# Patient Record
Sex: Female | Born: 1970 | Race: White | Hispanic: No | Marital: Married | Smoking: Current every day smoker
Health system: Southern US, Community
[De-identification: ages and names within clinical notes are randomized; demographics above are authoritative.]

## PROBLEM LIST (undated history)

## (undated) DIAGNOSIS — F419 Anxiety disorder, unspecified: Secondary | ICD-10-CM

## (undated) HISTORY — PX: WISDOM TOOTH EXTRACTION: SHX21

---

## 1997-12-22 HISTORY — PX: TUBAL LIGATION: SHX77

## 2022-01-09 ENCOUNTER — Encounter (INDEPENDENT_AMBULATORY_CARE_PROVIDER_SITE_OTHER): Payer: Self-pay | Admitting: *Deleted

## 2022-01-13 ENCOUNTER — Other Ambulatory Visit: Payer: Self-pay | Admitting: Certified Nurse Midwife

## 2022-01-13 DIAGNOSIS — N6489 Other specified disorders of breast: Secondary | ICD-10-CM

## 2022-01-14 ENCOUNTER — Other Ambulatory Visit: Payer: Self-pay | Admitting: Certified Nurse Midwife

## 2022-01-14 DIAGNOSIS — R928 Other abnormal and inconclusive findings on diagnostic imaging of breast: Secondary | ICD-10-CM

## 2022-01-22 ENCOUNTER — Ambulatory Visit
Admission: RE | Admit: 2022-01-22 | Discharge: 2022-01-22 | Disposition: A | Discharge status: Home / Self Care | Payer: Commercial Managed Care - HMO | Source: Ambulatory Visit | Attending: Certified Nurse Midwife | Admitting: Certified Nurse Midwife

## 2022-01-22 ENCOUNTER — Other Ambulatory Visit: Payer: Self-pay

## 2022-01-22 ENCOUNTER — Ambulatory Visit
Admission: RE | Admit: 2022-01-22 | Discharge: 2022-01-22 | Disposition: A | Discharge status: Home / Self Care | Payer: Self-pay | Source: Ambulatory Visit | Attending: Certified Nurse Midwife | Admitting: Certified Nurse Midwife

## 2022-01-22 ENCOUNTER — Other Ambulatory Visit: Payer: Self-pay | Admitting: Certified Nurse Midwife

## 2022-01-22 DIAGNOSIS — R928 Other abnormal and inconclusive findings on diagnostic imaging of breast: Secondary | ICD-10-CM

## 2022-01-22 HISTORY — PX: BREAST BIOPSY: SHX20

## 2022-01-29 ENCOUNTER — Encounter: Payer: Self-pay | Admitting: Adult Health

## 2022-01-29 DIAGNOSIS — C50111 Malignant neoplasm of central portion of right female breast: Secondary | ICD-10-CM | POA: Insufficient documentation

## 2022-02-03 ENCOUNTER — Telehealth: Payer: Self-pay | Admitting: Hematology and Oncology

## 2022-02-03 ENCOUNTER — Encounter: Payer: Self-pay | Admitting: Genetic Counselor

## 2022-02-03 ENCOUNTER — Other Ambulatory Visit: Payer: Self-pay | Admitting: Genetic Counselor

## 2022-02-03 DIAGNOSIS — C50111 Malignant neoplasm of central portion of right female breast: Secondary | ICD-10-CM

## 2022-02-03 DIAGNOSIS — Z170 Estrogen receptor positive status [ER+]: Secondary | ICD-10-CM

## 2022-02-03 NOTE — Telephone Encounter (Signed)
Scheduled pt for both oncology and genetics per 2/13 referral. Spoke to pt who is aware of both appts date and times. Pt is aware to arrive 15 mins prior to appt time.

## 2022-02-05 ENCOUNTER — Other Ambulatory Visit: Payer: Self-pay | Admitting: General Surgery

## 2022-02-05 ENCOUNTER — Other Ambulatory Visit (HOSPITAL_COMMUNITY): Payer: Self-pay | Admitting: General Surgery

## 2022-02-05 DIAGNOSIS — C50011 Malignant neoplasm of nipple and areola, right female breast: Secondary | ICD-10-CM

## 2022-02-05 DIAGNOSIS — Z170 Estrogen receptor positive status [ER+]: Secondary | ICD-10-CM

## 2022-02-05 DIAGNOSIS — R1031 Right lower quadrant pain: Secondary | ICD-10-CM

## 2022-02-05 DIAGNOSIS — C50111 Malignant neoplasm of central portion of right female breast: Secondary | ICD-10-CM

## 2022-02-05 NOTE — Progress Notes (Signed)
[facility] NOTE  Patient Care Team: Bradner, Vilinda Blanks, NP as PCP - General (Family Medicine)  CHIEF COMPLAINTS/PURPOSE OF CONSULTATION:  Newly diagnosed breast cancer  HISTORY OF PRESENTING ILLNESS:  Renee Schwartz 51 y.o. female is here because of recent diagnosis of right breast cancer. Patient had screening detected architectural distortion in the right breast, underwent diagnostic imaging.  No mass was seen on ultrasound and breast density was reported as C.  She had core needle biopsy which showed a grade 1 IDC, ER/PR positive, ER 95%, moderate staining, PR 100%, strong staining, HER2 negative, Ki-67 of 1%.  She has an MRI breast ordered by Dr. Barry Dienes. She is now referred to medical oncology for recommendations.  She is here with her husband.  She has MRI breast scheduled for later today.  She is otherwise healthy, denies any major medical issues.  She is an active smoker, smokes 1 pack/day.  Breast cancer runs in maternal side of the family, maternal grandmother and her sisters had breast cancer as well. Rest of the pertinent 10 point ROS reviewed and negative.  SUMMARY OF ONCOLOGIC HISTORY: Oncology History   No history exists.   MEDICAL HISTORY:  Reviewed and none.  SURGICAL HISTORY: She had tubal ligation many years ago after birth of her second child.  SOCIAL HISTORY: Social History   Socioeconomic History   Marital status: Married    Spouse name: Not on file   Number of children: Not on file   Years of education: Not on file   Highest education level: Not on file  Occupational History   Not on file  Tobacco Use   Smoking status: Not on file   Smokeless tobacco: Not on file  Substance and Sexual Activity   Alcohol use: Not on file   Drug use: Not on file   Sexual activity: Not on file  Other Topics Concern   Not on file  Social History Narrative   Not on file   Social Determinants of Health   Financial Resource Strain: Not on file   Food Insecurity: Not on file  Transportation Needs: Not on file  Physical Activity: Not on file  Stress: Not on file  Social Connections: Not on file  Intimate Partner Violence: Not on file    FAMILY HISTORY: Family History  Problem Relation Age of Onset   Cancer Mother 83       abdominal cancer; refused treatment - Panc vs colon   Huntington's disease Father 56       d. 55   Non-Hodgkin's lymphoma Brother 40   Cancer Maternal Grandmother        GYN cancer   Breast cancer Maternal Grandmother 50   Huntington's disease Paternal Grandmother 20       d. 18   Wilm's tumor Son 3       second dx at 5   Breast cancer Other 51       dbl mastectomy - MGMs sister    ALLERGIES:  has no allergies on file.  MEDICATIONS:  No current outpatient medications on file.   No current facility-administered medications for this visit.    REVIEW OF SYSTEMS:   Constitutional: Denies fevers, chills or abnormal night sweats Eyes: Denies blurriness of vision, double vision or watery eyes Ears, nose, mouth, throat, and face: Denies mucositis or sore throat Respiratory: Denies cough, dyspnea or wheezes Cardiovascular: Denies palpitation, chest discomfort or lower extremity swelling Gastrointestinal:  Denies nausea, heartburn or change  in bowel habits Skin: Denies abnormal skin rashes Lymphatics: Denies new lymphadenopathy or easy bruising Neurological:Denies numbness, tingling or new weaknesses Behavioral/Psych: Mood is stable, no new changes  Breast:  Denies any palpable lumps or discharge All other systems were reviewed with the patient and are negative.  PHYSICAL EXAMINATION: ECOG PERFORMANCE STATUS: 0 - Asymptomatic  Vitals:   02/06/22 0932  BP: 119/90  Pulse: 90  Resp: 16  Temp: 98.1 F (36.7 C)  SpO2: 96%   Filed Weights   02/06/22 0932  Weight: 176 lb 4.8 oz (80 kg)    GENERAL:alert, no distress and comfortable SKIN: skin color, texture, turgor are normal, no rashes or  significant lesions EYES: normal, conjunctiva are pink and non-injected, sclera clear OROPHARYNX:no exudate, no erythema and lips, buccal mucosa, and tongue normal  NECK: supple, thyroid normal size, non-tender, without nodularity LYMPH:  no palpable lymphadenopathy in the cervical, axillary or inguinal LUNGS: clear to auscultation and percussion with normal breathing effort HEART: regular rate & rhythm and no murmurs and no lower extremity edema ABDOMEN:abdomen soft, non-tender and normal bowel sounds Musculoskeletal:no cyanosis of digits and no clubbing  PSYCH: alert & oriented x 3 with fluent speech NEURO: no focal motor/sensory deficits BREAST: Bilateral breasts inspected.  Right breast 12 o'clock position biopsy site noted, no large palpable mass, some palpable firmness measuring about a centimeter noted.  No other regional lymphadenopathy.  Left breast normal to inspection and palpation.  LABORATORY DATA:  I have reviewed the data as listed Lab Results  Component Value Date   WBC 12.1 (H) 02/06/2022   HGB 16.0 (H) 02/06/2022   HCT 48.0 (H) 02/06/2022   MCV 87.8 02/06/2022   PLT 343 02/06/2022   Lab Results  Component Value Date   NA 138 02/06/2022   K 4.2 02/06/2022   CL 105 02/06/2022   CO2 27 02/06/2022    RADIOGRAPHIC STUDIES: I have personally reviewed the radiological reports and agreed with the findings in the report.  ASSESSMENT AND PLAN:  Malignant neoplasm of central portion of right breast in female, estrogen receptor positive ([facility]) This is a very pleasant 51 year old premenopausal female patient with newly diagnosed right breast invasive ductal carcinoma, size unclear at this time, awaiting MRI assessment, ER/PR positive, Ki-67 of 1%, HER2 negative on biopsy referred to breast center for further recommendations.  I do agree with considering MRI since we do not have an exact size on this tumor specimen.  Following MRI, if this tumor is indeed small, she can proceed  with upfront surgery followed by Oncotype testing if the tumor is greater than 5 mm.  We have discussed the following details about Oncotype.  We have discussed about Oncotype Dx score which is a well validated prognostic scoring system which can predict outcome with endocrine therapy alone and whether chemotherapy reduces recurrence.  Typically in patients with ER positive cancers that are node negative if the RS score is high typically greater than or equal to 26, chemotherapy is recommended.  In women with intermediate recurrence score younger than 13, there can still be some role for chemotherapy in addition to endocrine therapy especially if the recurrence score is between 21-25. If chemotherapy is needed, this will precede radiation and then after radiation she will continue on antiestrogen therapy.  Following Oncotype, she can proceed with adjuvant radiation if there is no indication for chemotherapy.  Following radiation, we have discussed about the consideration of antiestrogen therapy for 5 years.  We have discussed about tamoxifen  as well as aromatase inhibitors as well as ovarian suppression since she is premenopausal.  Given active smoking, we have discussed mostly about ovarian suppression and aromatase inhibitors concomitantly for the next 5 years.  Discussed about genetic testing, continuing lifestyle interventions such as regular exercise, healthy diet with more focus on plant-based diet, limiting alcohol intake and avoiding any hormone replacement therapy.  She will return to clinic for follow-up after surgery.  All questions were answered. The patient knows to call the clinic with any problems, questions or concerns.    Benay Pike, MD 02/06/22

## 2022-02-06 ENCOUNTER — Inpatient Hospital Stay: Payer: 59 | Attending: Hematology and Oncology | Admitting: Hematology and Oncology

## 2022-02-06 ENCOUNTER — Inpatient Hospital Stay: Payer: 59

## 2022-02-06 ENCOUNTER — Ambulatory Visit
Admission: RE | Admit: 2022-02-06 | Discharge: 2022-02-06 | Disposition: A | Discharge status: Home / Self Care | Payer: 59 | Source: Ambulatory Visit | Attending: General Surgery | Admitting: General Surgery

## 2022-02-06 ENCOUNTER — Inpatient Hospital Stay (HOSPITAL_BASED_OUTPATIENT_CLINIC_OR_DEPARTMENT_OTHER): Payer: 59 | Admitting: Genetic Counselor

## 2022-02-06 ENCOUNTER — Encounter: Payer: Self-pay | Admitting: Genetic Counselor

## 2022-02-06 ENCOUNTER — Other Ambulatory Visit: Payer: Self-pay

## 2022-02-06 ENCOUNTER — Other Ambulatory Visit: Payer: Self-pay | Admitting: *Deleted

## 2022-02-06 ENCOUNTER — Encounter: Payer: Self-pay | Admitting: Hematology and Oncology

## 2022-02-06 DIAGNOSIS — Z803 Family history of malignant neoplasm of breast: Secondary | ICD-10-CM | POA: Insufficient documentation

## 2022-02-06 DIAGNOSIS — Z170 Estrogen receptor positive status [ER+]: Secondary | ICD-10-CM | POA: Diagnosis not present

## 2022-02-06 DIAGNOSIS — C50111 Malignant neoplasm of central portion of right female breast: Secondary | ICD-10-CM | POA: Insufficient documentation

## 2022-02-06 DIAGNOSIS — Z8041 Family history of malignant neoplasm of ovary: Secondary | ICD-10-CM | POA: Diagnosis not present

## 2022-02-06 LAB — CMP (CANCER CENTER ONLY)
ALT: 11 U/L (ref 0–44)
AST: 12 U/L — ABNORMAL LOW (ref 15–41)
Albumin: 4.4 g/dL (ref 3.5–5.0)
Alkaline Phosphatase: 69 U/L (ref 38–126)
Anion gap: 6 (ref 5–15)
BUN: 18 mg/dL (ref 6–20)
CO2: 27 mmol/L (ref 22–32)
Calcium: 9.5 mg/dL (ref 8.9–10.3)
Chloride: 105 mmol/L (ref 98–111)
Creatinine: 0.84 mg/dL (ref 0.44–1.00)
GFR, Estimated: >60 mL/min (ref >60)
Glucose, Bld: 89 mg/dL (ref 70–99)
Potassium: 4.2 mmol/L (ref 3.5–5.1)
Sodium: 138 mmol/L (ref 135–145)
Total Bilirubin: 0.3 mg/dL (ref 0.3–1.2)
Total Protein: 7.5 g/dL (ref 6.5–8.1)

## 2022-02-06 LAB — CBC WITH DIFFERENTIAL (CANCER CENTER ONLY)
Abs Immature Granulocytes: 0.09 10*3/uL — ABNORMAL HIGH (ref 0.00–0.07)
Basophils Absolute: 0.1 10*3/uL (ref 0.0–0.1)
Basophils Relative: 0 %
Eosinophils Absolute: 0.1 10*3/uL (ref 0.0–0.5)
Eosinophils Relative: 1 %
HCT: 48 % — ABNORMAL HIGH (ref 36.0–46.0)
Hemoglobin: 16 g/dL — ABNORMAL HIGH (ref 12.0–15.0)
Immature Granulocytes: 1 %
Lymphocytes Relative: 27 %
Lymphs Abs: 3.2 10*3/uL (ref 0.7–4.0)
MCH: 29.3 pg (ref 26.0–34.0)
MCHC: 33.3 g/dL (ref 30.0–36.0)
MCV: 87.8 fL (ref 80.0–100.0)
Monocytes Absolute: 1 10*3/uL (ref 0.1–1.0)
Monocytes Relative: 9 %
Neutro Abs: 7.6 10*3/uL (ref 1.7–7.7)
Neutrophils Relative %: 62 %
Platelet Count: 343 10*3/uL (ref 150–400)
RBC: 5.47 MIL/uL — ABNORMAL HIGH (ref 3.87–5.11)
RDW: 12.5 % (ref 11.5–15.5)
WBC Count: 12.1 10*3/uL — ABNORMAL HIGH (ref 4.0–10.5)
nRBC: 0 % (ref 0.0–0.2)

## 2022-02-06 LAB — GENETIC SCREENING ORDER

## 2022-02-06 MED ORDER — GADOBUTROL 1 MMOL/ML IV SOLN
8.0000 mL | Freq: Once | INTRAVENOUS | Status: AC | PRN
  Administered 2022-02-06: 8 mL via INTRAVENOUS

## 2022-02-06 NOTE — Progress Notes (Signed)
REFERRING PROVIDER: Stark Klein, MD [address] [place],  [place] [address]  PRIMARY PROVIDER:  Bing Neighbors, NP  PRIMARY REASON FOR VISIT:  1. Malignant neoplasm of central portion of right breast in female, estrogen receptor positive ([place])   2. Family history of breast cancer      HISTORY OF PRESENT ILLNESS:   Renee Schwartz, a 51 y.o. female, was seen for a [facility] cancer genetics consultation at the request of Dr. Barry Dienes due to a personal and family history of breast cancer.  Renee Schwartz presents to clinic today to discuss the possibility of a hereditary predisposition to cancer, genetic testing, and to further clarify her future cancer risks, as well as potential cancer risks for family members.   In 2023, at the age of 59, Renee Schwartz was diagnosed with breast cancer.  The treatment plan includes a lumpectomy and radiation.  The patient reports [place] age 53.  Over her time there she reports hearing about 3 radiation leaks.     CANCER HISTORY:  Oncology History   No history exists.     RISK FACTORS:  Menarche was at age 76.  First live birth at age 72.  OCP use for approximately 0 years.  Ovaries intact: yes.  Hysterectomy: no.  Menopausal status: perimenopausal.  HRT use: 0 years. Colonoscopy: no; not examined. Mammogram within the last year: yes. Number of breast biopsies: 1. Up to date with pelvic exams: yes. Any excessive radiation exposure in the past: yes  No past medical history on file.    Social History   Socioeconomic History   Marital status: Married    Spouse name: Not on file   Number of children: Not on file   Years of education: Not on file   Highest education level: Not on file  Occupational History   Not on file  Tobacco Use   Smoking status: Not on file   Smokeless tobacco: Not on file  Substance and Sexual Activity   Alcohol use: Not on file   Drug use: Not on file   Sexual  activity: Not on file  Other Topics Concern   Not on file  Social History Narrative   Not on file   Social Determinants of Health   Financial Resource Strain: Not on file  Food Insecurity: Not on file  Transportation Needs: Not on file  Physical Activity: Not on file  Stress: Not on file  Social Connections: Not on file     FAMILY HISTORY:  We obtained a detailed, 4-generation family history.  Significant diagnoses are listed below: Family History  Problem Relation Age of Onset   Cancer Mother 52       abdominal cancer; refused treatment - Panc vs colon   Huntington's disease Father 32       d. 22   Non-Hodgkin's lymphoma Brother 53   Cancer Maternal Grandmother        GYN cancer   Breast cancer Maternal Grandmother 6   Huntington's disease Paternal Grandmother 23       d. 12   Wilm's tumor Son 3       second dx at 63   Breast cancer Other 10       dbl mastectomy - MGMs sister     The patient has a son and daughter.  Her son was diagnosed with wilms tumor at ages 52 and 56. He reportedly has hemihypertrophy  and a larger sized tongue with is suggestive of Beckwith Wiedemann syndrome.  She has a brother and sister.  The brother was treated for Non-hodgkin's lymphoma at 42.  Both parents are deceased.  The patient's father died at 51 from Huntington's disease.  He ha three sisters and two brothers, none who reportedly are showing signs of HD.  The paternal grandparents are deceased. The grandmother from HD at age 69.  The patient's mother died at 51 from either pancreatic or colon cancer.  She has a brother and sister who are cancer free.  The maternal grandparents are deceased. The grandmother had a GYN tumor that ruptured after her last pregnancy and caused her to have a TAH/BSO.  She developed breast cancer in her late 60's-70's.,  She has several sisters who also had breast cancer.  Renee Schwartz is unaware of previous family history of genetic testing for hereditary cancer  risks. Patient's maternal ancestors are of Caucasian descent, and paternal ancestors are of [place] descent. There is no reported Ashkenazi Jewish ancestry. There is no known consanguinity.  GENETIC COUNSELING ASSESSMENT: Renee Schwartz is a 51 y.o. female with a personal and family history of cancer which is somewhat suggestive of a hereditary breast cancer syndrome and predisposition to cancer given the number of women in the family with breast cancer and the patient's young age of onset. We, therefore, discussed and recommended the following at today's visit.   DISCUSSION: We discussed that, in general, most cancer is not inherited in families, but instead is sporadic or familial. Sporadic cancers occur by chance and typically happen at older ages (>50 years) as this type of cancer is caused by genetic changes acquired during an individuals lifetime. Some families have more cancers than would be expected by chance; however, the ages or types of cancer are not consistent with a known genetic mutation or known genetic mutations have been ruled out. This type of familial cancer is thought to be due to a combination of multiple genetic, environmental, hormonal, and lifestyle factors. While this combination of factors likely increases the risk of cancer, the exact source of this risk is not currently identifiable or testable.  We discussed that 5 - 10% of breast cancer is hereditary, with most cases associated with BRCA mutations.  There are other genes that can be associated with hereditary breast cancer syndromes.  These include ATM, CHEK2 and PALB2.  Based on the patient's son's diagnosis of wilms tumor, and her mother possibly having colon cancer, there is a chance that Lynch syndrome is running in the family.  Additionally, wilms tumor can be associated with a mutation in [place].  We will make sure that we offer testing for all of these conditions.  We also discussed Huntington's disease.  HD is a dominant  condition, and therefore she is at 50% risk for developing HD at some point in her life.  We discussed the phenomenon of anticipation where in subsequent generations we can see younger and younger onset of symptoms.  The patient is currently older than her father at his time of diagnosis.  While this does not preclude her from being affected, it can be hopeful that she may not have inherited this condition.  I offered to refer her to a Huntington's Disease testing program.  The patient has declined this referral.  We discussed that testing is beneficial for several reasons including knowing how to follow individuals after completing their treatment, identifying whether potential treatment options such as PARP inhibitors  would be beneficial, and understand if other family members could be at risk for cancer and allow them to undergo genetic testing.   We reviewed the characteristics, features and inheritance patterns of hereditary cancer syndromes. We also discussed genetic testing, including the appropriate family members to test, the process of testing, insurance coverage and turn-around-time for results. We discussed the implications of a negative, positive, carrier and/or variant of uncertain significant result. We recommended Renee Schwartz pursue genetic testing for the Multi cancer gene panel + RNA.    The Multi-Gene Panel offered by [facility] includes sequencing and/or deletion duplication testing of the following 84 genes: AIP, ALK, APC, ATM, AXIN2,BAP1,  BARD1, BLM, BMPR1A, BRCA1, BRCA2, BRIP1, CASR, CDC73, CDH1, CDK4, CDKN1B, CDKN1C, CDKN2A (p14ARF), CDKN2A (p16INK4a), CEBPA, CHEK2, CTNNA1, DICER1, DIS3L2, EGFR (c.2369C>T, p.Thr790Met variant only), EPCAM (Deletion/duplication testing only), FH, FLCN, GATA2, GPC3, GREM1 (Promoter region deletion/duplication testing only), HOXB13 (c.251G>A, p.Gly84Glu), HRAS, KIT, MAX, MEN1, MET, MITF (c.952G>A, p.Glu318Lys variant only), MLH1, MSH2, MSH3, MSH6, MUTYH, NBN,  NF1, NF2, NTHL1, PALB2, PDGFRA, PHOX2B, PMS2, POLD1, POLE, POT1, PRKAR1A, PTCH1, PTEN, RAD50, RAD51C, RAD51D, RB1, RECQL4, RET, RUNX1, SDHAF2, SDHA (sequence changes only), SDHB, SDHC, SDHD, SMAD4, SMARCA4, SMARCB1, SMARCE1, STK11, SUFU, TERC, TERT, TMEM127, TP53, TSC1, TSC2, VHL, WRN and WT1.    Based on Renee Schwartz personal and family history of cancer, she meets medical criteria for genetic testing. Despite that she meets criteria, she may still have an out of pocket cost. We discussed that if her out of pocket cost for testing is over $100, the laboratory will call and confirm whether she wants to proceed with testing.  If the out of pocket cost of testing is less than $100 she will be billed by the genetic testing laboratory.   PLAN: After considering the risks, benefits, and limitations, Renee Schwartz provided informed consent to pursue genetic testing and the blood sample was sent to [facility] for analysis of the STAT breast panel and the Multi-cancer gene panel. Results should be available within approximately 2-3 weeks' time, at which point they will be disclosed by telephone to Renee Schwartz, as will any additional recommendations warranted by these results. Renee Schwartz will receive a summary of her genetic counseling visit and a copy of her results once available. This information will also be available in Epic.   Lastly, we encouraged Renee Schwartz to remain in contact with cancer genetics annually so that we can continuously update the family history and inform her of any changes in cancer genetics and testing that may be of benefit for this family.   Renee Schwartz questions were answered to her satisfaction today. Our contact information was provided should additional questions or concerns arise. Thank you for the referral and allowing [place] to share in the care of your patient.   Rayshard Schirtzinger P. Florene [place], [place], [facility] Licensed, Insurance risk surveyor [email or web address] phone:  [phone]  The patient was seen for a total of 40 minutes in face-to-face genetic counseling.  The patient was seen alone.  This patient was discussed with Drs. Magrinat, Lindi Adie and/or Burr Medico who agrees with the above.    _______________________________________________________________________ For Office Staff:  Number of people involved in session: 1 Was an Intern/ student involved with case: no

## 2022-02-06 NOTE — Assessment & Plan Note (Signed)
This is a very pleasant 51 year old premenopausal female patient with newly diagnosed right breast invasive ductal carcinoma, size unclear at this time, awaiting MRI assessment, ER/PR positive, Ki-67 of 1%, HER2 negative on biopsy referred to breast center for further recommendations.  I do agree with considering MRI since we do not have an exact size on this tumor specimen.  Following MRI, if this tumor is indeed small, she can proceed with upfront surgery followed by Oncotype testing if the tumor is greater than 5 mm.  We have discussed the following details about Oncotype.  We have discussed about Oncotype Dx score which is a well validated prognostic scoring system which can predict outcome with endocrine therapy alone and whether chemotherapy reduces recurrence.  Typically in patients with ER positive cancers that are node negative if the RS score is high typically greater than or equal to 26, chemotherapy is recommended.  In women with intermediate recurrence score younger than 6, there can still be some role for chemotherapy in addition to endocrine therapy especially if the recurrence score is between 21-25. If chemotherapy is needed, this will precede radiation and then after radiation she will continue on antiestrogen therapy.  Following Oncotype, she can proceed with adjuvant radiation if there is no indication for chemotherapy.  Following radiation, we have discussed about the consideration of antiestrogen therapy for 5 years.  We have discussed about tamoxifen as well as aromatase inhibitors as well as ovarian suppression since she is premenopausal.  Given active smoking, we have discussed mostly about ovarian suppression and aromatase inhibitors concomitantly for the next 5 years.  Discussed about genetic testing, continuing lifestyle interventions such as regular exercise, healthy diet with more focus on plant-based diet, limiting alcohol intake and avoiding any hormone replacement  therapy.

## 2022-02-07 ENCOUNTER — Telehealth: Payer: Self-pay | Admitting: *Deleted

## 2022-02-07 ENCOUNTER — Other Ambulatory Visit: Payer: Self-pay | Admitting: *Deleted

## 2022-02-07 ENCOUNTER — Ambulatory Visit
Admission: RE | Admit: 2022-02-07 | Discharge: 2022-02-07 | Disposition: A | Discharge status: Home / Self Care | Payer: 59 | Source: Ambulatory Visit | Attending: General Surgery | Admitting: General Surgery

## 2022-02-07 ENCOUNTER — Other Ambulatory Visit: Payer: Self-pay | Admitting: General Surgery

## 2022-02-07 ENCOUNTER — Encounter: Payer: Self-pay | Admitting: *Deleted

## 2022-02-07 DIAGNOSIS — C50111 Malignant neoplasm of central portion of right female breast: Secondary | ICD-10-CM

## 2022-02-07 DIAGNOSIS — R9389 Abnormal findings on diagnostic imaging of other specified body structures: Secondary | ICD-10-CM

## 2022-02-07 DIAGNOSIS — R1031 Right lower quadrant pain: Secondary | ICD-10-CM

## 2022-02-07 MED ORDER — IOPAMIDOL (ISOVUE-300) INJECTION 61%
100.0000 mL | Freq: Once | INTRAVENOUS | Status: AC | PRN
  Administered 2022-02-07: 100 mL via INTRAVENOUS

## 2022-02-07 NOTE — Telephone Encounter (Signed)
Spoke to pt, provided navigation resources and contact information.  Discussed MRI results and need for MRI bx to determine extent of dz. Received verbal understanding. Provided emotional support and encouragement No further needs or questions voiced at this time.

## 2022-02-10 NOTE — Progress Notes (Signed)
Radiation Oncology         [phone] ________________________________  Initial Outpatient Consultation - Conducted via telephone due to current COVID-19 concerns for limiting patient exposure  I spoke with the patient to conduct this consult visit via telephone to spare the patient unnecessary potential exposure in the healthcare setting during the current COVID-19 pandemic. The patient was notified in advance and was offered a [facility] meeting to allow for face to face communication but unfortunately reported that they did not have the appropriate resources/technology to support such a visit and instead preferred to proceed with a telephone consult.   ________________________________  Name: Renee Schwartz        MRN: [id number]  Date of Service: 02/13/2022 DOB: 01-22-1971  VU:[id number], Renee Blanks, NP  Stark Klein, MD     REFERRING PHYSICIAN: Stark Klein, MD   DIAGNOSIS: The encounter diagnosis was Malignant neoplasm of central portion of right breast in female, estrogen receptor positive ([place]).   HISTORY OF PRESENT ILLNESS: Renee Schwartz is a 51 y.o. female with a new diagnosis of right breast cancer.  The patient was found on screening mammogram to have a focal asymmetry in the right breast.  Further diagnostic work-up could not identify an correlate by ultrasound but the lesion in the upper outer quadrant was sampled with stereotactic biopsy.  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  She underwent an MRI of the breasts on 02/06/2022 but showed the known biopsied mass in the right breast measuring up to 3 cm with nearly contiguous smaller mass inferior and lateral that measured 1.2 cm in the inferior to the biopsy mass also non-mass enhancement measuring up to 3 x 4.8 cm and no other abnormalities in the right breast or left breast and no evidence of adenopathy.  She did have a CT chest abdomen and pelvis on 02/07/2022  that showed some benign-appearing pulmonary nodules less than 5 mm in size.  A bone scan was performed yesterday and is pending final results.  Given the findings in her MRI scan she is scheduled to undergo another biopsy under MRI guidance tomorrow.  She is hopeful to be able to pursue breast conserving surgery but is also open to mastectomy and reconstruction if needed.  She is contacted by phone today to discuss treatment recommendations of adjuvant radiotherapy.  PREVIOUS RADIATION THERAPY: No   PAST MEDICAL HISTORY: History reviewed. No pertinent past medical history.     PAST SURGICAL HISTORY:History reviewed. No pertinent surgical history.  The patient is married and lives in [place].  She works as a Freight forwarder at [employer].    FAMILY HISTORY:  Family History  Problem Relation Age of Onset   Cancer Mother 41       abdominal cancer; refused treatment - Panc vs colon   Huntington's disease Father 76       d. 79   Non-Hodgkin's lymphoma Brother 10   Cancer Maternal Grandmother        GYN cancer   Breast cancer Maternal Grandmother 67   Huntington's disease Paternal Grandmother 47       d. 39   Wilm's tumor Son 3       second dx at 72   Breast cancer Other 69       dbl mastectomy - MGMs sister     SOCIAL HISTORY:  reports that she has been smoking cigarettes. She has never  used smokeless tobacco. She reports that she does not drink alcohol.   ALLERGIES: Lisinopril and Penicillins   MEDICATIONS:  Current Outpatient Medications  Medication Sig Dispense Refill   ergocalciferol (VITAMIN D2) 1.25 MG (50000 UT) capsule Take by mouth.     magnesium oxide (MAG-OX) 400 MG tablet Take 400 mg by mouth daily.     No current facility-administered medications for this encounter.     REVIEW OF SYSTEMS: On review of systems, the patient reports that she is doing well overall.  She is anxious to find the results of her upcoming biopsy that she has tomorrow.  She would  like to be able to conserve her breast and have radiation if possible.  She has had some pain in the breast since her biopsy and reports less than adequate anesthesia from the initial biopsy and is concerned about tomorrow's procedure.  No other complaints are verbalized.     PHYSICAL EXAM:  Unable to assess given encounter type.  ECOG = 1  0 - Asymptomatic (Fully active, able to carry on all predisease activities without restriction)  1 - Symptomatic but completely ambulatory (Restricted in physically strenuous activity but ambulatory and able to carry out work of a light or sedentary nature. For example, light housework, office work)  2 - Symptomatic, <50% in bed during the day (Ambulatory and capable of all self care but unable to carry out any work activities. Up and about more than 50% of waking hours)  3 - Symptomatic, >50% in bed, but not bedbound (Capable of only limited self-care, confined to bed or chair 50% or more of waking hours)  4 - Bedbound (Completely disabled. Cannot carry on any self-care. Totally confined to bed or chair)  5 - Death   Eustace Pen MM, Creech RH, Tormey DC, et al. [id number]). "Toxicity and response criteria of the [facility]". [place] Oncol. 5 (6): 649-55    LABORATORY DATA:  Lab Results  Component Value Date   WBC 12.1 (H) 02/06/2022   HGB 16.0 (H) 02/06/2022   HCT 48.0 (H) 02/06/2022   MCV 87.8 02/06/2022   PLT 343 02/06/2022   Lab Results  Component Value Date   NA 138 02/06/2022   K 4.2 02/06/2022   CL 105 02/06/2022   CO2 27 02/06/2022   Lab Results  Component Value Date   ALT 11 02/06/2022   AST 12 (L) 02/06/2022   ALKPHOS 69 02/06/2022   BILITOT 0.3 02/06/2022      RADIOGRAPHY: CT CHEST ABDOMEN PELVIS W CONTRAST  Result Date: 02/10/2022 CLINICAL DATA:  51 year old female with history of right lower quadrant abdominal and flank pain for the past 6 months. History of breast cancer. EXAM: CT CHEST, ABDOMEN,  AND PELVIS WITH CONTRAST TECHNIQUE: Multidetector CT imaging of the chest, abdomen and pelvis was performed following the standard protocol during bolus administration of intravenous contrast. RADIATION DOSE REDUCTION: This exam was performed according to the departmental dose-optimization program which includes automated exposure control, adjustment of the mA and/or kV according to patient size and/or use of iterative reconstruction technique. CONTRAST:  178mL ISOVUE-300 IOPAMIDOL (ISOVUE-300) INJECTION 61% COMPARISON:  No priors. FINDINGS: CT CHEST FINDINGS Cardiovascular: Heart size is normal. There is no significant pericardial fluid, thickening or pericardial calcification. No atherosclerotic calcifications are noted in the thoracic aorta or the coronary arteries. Mediastinum/Nodes: No pathologically enlarged mediastinal or hilar lymph nodes. Esophagus is unremarkable in appearance. No axillary lymphadenopathy. Lungs/Pleura: 7 x 3 mm (mean diameter of  5 mm) subpleural nodule in the anterior aspect of the right lower lobe abutting the major fissure (axial image 88 of series 4). 7 x 3 mm (mean diameter 5 mm) right lower lobe subpleural nodule posteriorly (axial image 68 of series 4). 4 mm right middle lobe pulmonary nodule (axial image 89 of series 4). No other larger more suspicious appearing pulmonary nodules or masses are noted. No acute consolidative airspace disease. No pleural effusions. Musculoskeletal: There are no aggressive appearing lytic or blastic lesions noted in the visualized portions of the skeleton. CT ABDOMEN PELVIS FINDINGS Hepatobiliary: Benign appearing area of low attenuation or decreased enhancement in segment 4B of the liver adjacent to the falciform ligament, most compatible with focal fatty infiltration and/or a benign perfusion anomaly. No other suspicious appearing hepatic lesions. No intra or extrahepatic biliary ductal dilatation. Gallbladder is unremarkable in appearance. Pancreas:  No pancreatic mass. No pancreatic ductal dilatation. No pancreatic or peripancreatic fluid collections or inflammatory changes. Spleen: Unremarkable. Adrenals/Urinary Tract: Bilateral kidneys and adrenal glands are normal in appearance. No hydroureteronephrosis. Urinary bladder is normal in appearance. Stomach/Bowel: The appearance of the stomach is normal. There is no pathologic dilatation of small bowel or colon. Normal appendix. Vascular/Lymphatic: No significant atherosclerotic disease, aneurysm or dissection noted in the abdominal or pelvic vasculature. No lymphadenopathy noted in the abdomen or pelvis. Reproductive: Uterus and ovaries are unremarkable in appearance. Other: No significant volume of ascites.  No pneumoperitoneum. Musculoskeletal: There are no aggressive appearing lytic or blastic lesions noted in the visualized portions of the skeleton. IMPRESSION: 1. No acute findings are noted in the chest, abdomen or pelvis to account for the patient's symptoms. 2. Small pulmonary nodules measuring 5 mm or less in size, nonspecific, but statistically likely benign. No follow-up needed if patient is low-risk (and has no known or suspected primary neoplasm). Non-contrast chest CT can be considered in 12 months if patient is high-risk. This recommendation follows the consensus statement: Guidelines for Management of Incidental Pulmonary Nodules Detected on CT Images: From the Fleischner Society 2017; Radiology 2017; 284:228-243. Electronically Signed   By: Vinnie Langton M.D.   On: 02/10/2022 06:51   MR BREAST BILATERAL W WO CONTRAST INC CAD  Result Date: 02/06/2022 CLINICAL DATA:  Newly diagnosed right breast carcinoma. Assess for extent of disease. Patient was diagnosed with grade 1 invasive ductal carcinoma of the right breast following stereotactic core needle biopsy of an architectural distortion. Biopsy site marked with an X shaped biopsy clip. EXAM: BILATERAL BREAST MRI WITH AND WITHOUT CONTRAST  TECHNIQUE: Multiplanar, multisequence MR images of both breasts were obtained prior to and following the intravenous administration of 8 ml of Gadavist Three-dimensional MR images were rendered by post-processing of the original MR data on an independent workstation. The three-dimensional MR images were interpreted, and findings are reported in the following complete MRI report for this study. Three dimensional images were evaluated at the independent interpreting workstation using the DynaCAD thin client. COMPARISON:  Previous exam(s). FINDINGS: Breast composition: b. Scattered fibroglandular tissue. Background parenchymal enhancement: Mild Right breast: There is an ill-defined mass around by non mass enhancement in the upper right breast, associated with susceptibility artifact along its superior margin reflecting the X shaped biopsy clip the ill-defined mass measures approximately 3.0 x 1.8 x 2.0 cm. There is also non mass enhancement that extends inferior to the mass with an adjacent ill-defined mass inferior and lateral to the primary mass, measuring 1.2 x 0.8 x 0.8 cm. The overall area of non mass  enhancement, to include the biopsied 3 cm mass, measures 3.0 x 1.8 x 4.8 cm, 4.8 cm dimension is superior to inferior. There are no other right breast masses or areas of abnormal right breast enhancement. Left breast: No mass or abnormal enhancement. Lymph nodes: No abnormal appearing lymph nodes. Ancillary findings:  None. IMPRESSION: 1. The biopsied right breast invasive ductal carcinoma is reflected by an ill-defined mass measuring approximately 3.0 x 1.8 x 2.0 cm. There is a nearly contiguous smaller mass inferior and lateral to the biopsied mass, that measures 1.2 x 0.8 x 0.8 cm. Also, inferior to the biopsied mass, is non mass enhancement, with the overall dimensions of the biopsied mass and non mass enhancement being 3.0 x 1.8 x 4.8 cm. The areas of non mass enhancement and the adjacent 1.2 cm mass or  suspicious for additional malignancy. 2. No other right breast masses or areas of abnormal enhancement. 3. No evidence of left breast malignancy. 4. No evidence of metastatic lymphadenopathy. RECOMMENDATION: 1. Recommend MRI guided core needle biopsy of the non mass enhancement inferior to the biopsied mass as well as the 1.2 cm mass inferior and lateral to the biopsied mass, to assess for extent of disease, if this would alter planned treatment. BI-RADS CATEGORY  4: Suspicious. Electronically Signed   By: Lajean Manes M.D.   On: 02/06/2022 16:26  MM CLIP PLACEMENT RIGHT  Addendum Date: 01/24/2022   ADDENDUM REPORT: 01/24/2022 13:24 ADDENDUM: Pathology revealed GRADE I INVASIVE DUCTAL CARCINOMA of the RIGHT breast architectural distortion, upper central (X clip). This was found to be concordant by Dr. Peggye Fothergill. Pathology results were discussed with the patient by telephone. The patient reported doing well after the biopsy with tenderness at the site. Post biopsy instructions and care were reviewed and questions were answered. The patient was encouraged to call [facility] for any additional concerns. Per patient request, surgical consultation has been arranged with Dr. Stark Klein at [facility] on January 31, 2022. Pathology results reported by Stacie Acres RN on 01/24/2022. Electronically Signed   By: Evangeline Dakin M.D.   On: 01/24/2022 13:24   Result Date: 01/24/2022 CLINICAL DATA:  51 year old with a screening detected focus of architectural distortion in the upper RIGHT breast middle depth. EXAM: RIGHT BREAST STEREOTACTIC TOMOSYNTHESIS CORE NEEDLE BIOPSY COMPARISON:  Previous exams which were all performed in [place], [place], most recently 11/06/2021. FINDINGS: The patient and I discussed the procedure of stereotactic-guided biopsy including benefits and alternatives. We discussed the high likelihood of a successful procedure. We discussed the risks of  the procedure including infection, bleeding, tissue injury, clip migration, and inadequate sampling. Informed written consent was given. The usual time out protocol was performed immediately prior to the procedure. Using sterile technique with chlorhexidine as skin antisepsis, 1% lidocaine and 1% lidocaine with epinephrine as local anesthetic, under stereotactic tomosynthesis guidance, a 9 gauge vacuum Brevera assisted device was used to perform core needle biopsy of the architectural distortion involving the upper breast at middle depth using a superior approach. Specimen radiograph was performed showing soft tissue density in multiple core samples. Lesion quadrant: Upper breast at middle depth, near 12 o'clock location. At the conclusion of the procedure, an X shaped tissue marker clip was deployed into the biopsy cavity. Follow-up 2D and 3D full field CC and mediolateral mammographic images confirm that the clip is appropriately positioned at the site of the biopsied distortion. Expected post biopsy changes are present without evidence of hematoma.  IMPRESSION: 1. Stereotactic tomosynthesis-guided biopsy of architectural distortion involving the upper RIGHT breast at middle depth. No apparent complications. 2. Appropriate positioning of the X shaped tissue marking clip at the site of the biopsied distortion. Electronically Signed: By: Evangeline Dakin M.D. On: 01/22/2022 14:07   MM RT BREAST BX W LOC DEV 1ST LESION IMAGE BX SPEC STEREO GUIDE  Addendum Date: 01/24/2022   ADDENDUM REPORT: 01/24/2022 13:24 ADDENDUM: Pathology revealed GRADE I INVASIVE DUCTAL CARCINOMA of the RIGHT breast architectural distortion, upper central (X clip). This was found to be concordant by Dr. Peggye Fothergill. Pathology results were discussed with the patient by telephone. The patient reported doing well after the biopsy with tenderness at the site. Post biopsy instructions and care were reviewed and questions were answered. The  patient was encouraged to call [facility] for any additional concerns. Per patient request, surgical consultation has been arranged with Dr. Stark Klein at [facility] on January 31, 2022. Pathology results reported by Stacie Acres RN on 01/24/2022. Electronically Signed   By: Evangeline Dakin M.D.   On: 01/24/2022 13:24   Result Date: 01/24/2022 CLINICAL DATA:  51 year old with a screening detected focus of architectural distortion in the upper RIGHT breast middle depth. EXAM: RIGHT BREAST STEREOTACTIC TOMOSYNTHESIS CORE NEEDLE BIOPSY COMPARISON:  Previous exams which were all performed in [place], [place], most recently 11/06/2021. FINDINGS: The patient and I discussed the procedure of stereotactic-guided biopsy including benefits and alternatives. We discussed the high likelihood of a successful procedure. We discussed the risks of the procedure including infection, bleeding, tissue injury, clip migration, and inadequate sampling. Informed written consent was given. The usual time out protocol was performed immediately prior to the procedure. Using sterile technique with chlorhexidine as skin antisepsis, 1% lidocaine and 1% lidocaine with epinephrine as local anesthetic, under stereotactic tomosynthesis guidance, a 9 gauge vacuum Brevera assisted device was used to perform core needle biopsy of the architectural distortion involving the upper breast at middle depth using a superior approach. Specimen radiograph was performed showing soft tissue density in multiple core samples. Lesion quadrant: Upper breast at middle depth, near 12 o'clock location. At the conclusion of the procedure, an X shaped tissue marker clip was deployed into the biopsy cavity. Follow-up 2D and 3D full field CC and mediolateral mammographic images confirm that the clip is appropriately positioned at the site of the biopsied distortion. Expected post biopsy changes are present without evidence of  hematoma. IMPRESSION: 1. Stereotactic tomosynthesis-guided biopsy of architectural distortion involving the upper RIGHT breast at middle depth. No apparent complications. 2. Appropriate positioning of the X shaped tissue marking clip at the site of the biopsied distortion. Electronically Signed: By: Evangeline Dakin M.D. On: 01/22/2022 14:07      IMPRESSION/PLAN: 1. Probable Stage IA, [id number], grade 1, ER/PR positive invasive ductal carcinoma of the right breast. Dr. Lisbeth Renshaw has reviewed the patient's case.  I spent time with her today discussing early stage right breast cancer, and the rationale for her upcoming MRI  biopsy.  Hopefully she can still undergo conserving surgery with lumpectomy and sentinel lymph node biopsy.  Depending on the size of the final tumor measurements rendered by pathology, the tumor may be tested for Oncotype Dx score to determine a role for systemic therapy. Provided that chemotherapy is not indicated, the patient's course would then be followed by external radiotherapy to the breast  to reduce risks of local recurrence followed by antiestrogen therapy. We discussed the risks,  benefits, short, and long term effects of radiotherapy, as well as the curative intent, and the patient is interested in proceeding.  I discussed the delivery and logistics of radiotherapy and that Dr. Lisbeth Renshaw would anticipate a course of 6 1/2 weeks of radiotherapy to the right breast.  We also discussed options of hypofractionation versus ultra hypofractionation.  She is motivated to consider either 6-1/2 weeks of therapy based on the long-term outcome data of this regimen but may be motivated for hypofractionation.  Given the location of where she lives and works compared to our office, she prefers evaluation and treatment in [place].  We will refer her to the [facility] group that goes to [facility] in [place].  We appreciate their input, and would be happy to see her as needed moving forward.       Given  current concerns for patient exposure during the COVID-19 pandemic, this encounter was conducted via telephone.  The patient has provided two factor identification and has given verbal consent for this type of encounter and has been advised to only accept a meeting of this type in a secure network environment. The time spent during this encounter was 60 minutes including preparation, discussion, and coordination of the patient's care. The attendants for this meeting include Blenda Nicely, RN,  Hayden Pedro  and Donnajean Lopes.  During the encounter,  Blenda Nicely, RN,  and Hayden Pedro were located at [facility] Radiation Oncology Department.  ELANA JIAN was located at work  The above documentation reflects my direct findings during this shared patient visit. Please see the separate note by Dr. Lisbeth Renshaw on this date for the remainder of the patient's plan of care.    Carola Rhine, [facility]    **Disclaimer: This note was dictated with voice recognition software. Similar sounding words can inadvertently be transcribed and this note may contain transcription errors which may not have been corrected upon publication of note.**

## 2022-02-11 ENCOUNTER — Encounter: Payer: Self-pay | Admitting: *Deleted

## 2022-02-12 ENCOUNTER — Encounter (HOSPITAL_COMMUNITY)
Admission: RE | Admit: 2022-02-12 | Discharge: 2022-02-12 | Disposition: A | Discharge status: Home / Self Care | Payer: 59 | Source: Ambulatory Visit | Attending: General Surgery | Admitting: General Surgery

## 2022-02-12 ENCOUNTER — Other Ambulatory Visit: Payer: Self-pay

## 2022-02-12 DIAGNOSIS — C50011 Malignant neoplasm of nipple and areola, right female breast: Secondary | ICD-10-CM | POA: Diagnosis present

## 2022-02-12 DIAGNOSIS — Z170 Estrogen receptor positive status [ER+]: Secondary | ICD-10-CM | POA: Diagnosis present

## 2022-02-12 MED ORDER — TECHNETIUM TC 99M MEDRONATE IV KIT
20.0000 | PACK | Freq: Once | INTRAVENOUS | Status: AC | PRN
  Administered 2022-02-12: 21.8 via INTRAVENOUS

## 2022-02-12 NOTE — Progress Notes (Signed)
New Breast Cancer Diagnosis: Right Breast Cancer- UOQ  Did patient present with symptoms (if so, please note symptoms) or screening mammography?:Screening mammogram showed focal asymmetry in the right breast.  MRI Breast 02/06/2022: The biopsied right breast invasive ductal carcinoma is reflected by an ill-defined mass measuring approximately 3.0 x 1.8 x 2.0 cm.  There is a nearly contiguous smaller mass inferior and lateral to the biopsied mass, that measures 1.2 x 0.8 x 0.8 cm. Also, inferior to the biopsied mass, is non mass enhancement, with the overall dimensions of the biopsied mass and non mass enhancement being 3.0 x 1.8 x 4.8 cm. The areas of non mass enhancement and the adjacent 1.2 cm mass or suspicious for additional malignancy.  No other right breast masses or areas of abnormal enhancement.  No evidence of left breast malignancy.  No evidence of metastatic lymphadenopathy.   Histology per Pathology Report: grade 1, Invasive Ductal Carcinoma 01/22/2022  Receptor Status: ER(positive), PR (positive), Her2-neu (negative), Ki-(1%)  Surgeon and surgical plan, if any:  Dr. Barry Dienes 01/31/2022 -This is an unclear size. Will plan MRI for better definition of the cancer.  -Barring unforeseen findings, will plan right breast seed localized lumpectomy and sentinel node biopsy.  -She will need genetics referral due to her age.  -Surgery will likely be followed by oncotype if invasive cancer in >5 mm.  -Additionally, adjuvant radiation and chemoprevention with antihormonal tx will be indicated. Consideration for ovarian blockade will also be present.  -She will need oncology and rad onc referrals.   -Biopsy 02/14/2022   Medical oncologist, treatment if any:   Dr. Chryl Heck 02/06/2022 - I do agree with considering MRI since we do not have an exact size on this tumor specimen.   -Following MRI, if this tumor is indeed small, she can proceed with upfront surgery followed by Oncotype testing if the tumor  is greater than 5 mm.   -Following Oncotype, she can proceed with adjuvant radiation if there is no indication for chemotherapy.   -Following radiation, we have discussed about the consideration of antiestrogen therapy for 5 years.    Family History of Breast/Ovarian/Prostate Cancer: Maternal grandmother and Maternal Aunts had breast cancer.   Lymphedema issues, if any:       Pain issues, if any: She continues to have some tenderness at the biopsy site.  She notes having a little bump (size of pinhead) on her breast.    SAFETY ISSUES: Prior radiation? No Pacemaker/ICD? No Possible current pregnancy? Sporadic cycles, pre-menopause. Is the patient on methotrexate? No  Current Complaints / other details:   Genetics- Negative

## 2022-02-13 ENCOUNTER — Ambulatory Visit
Admission: RE | Admit: 2022-02-13 | Discharge: 2022-02-13 | Disposition: A | Discharge status: Home / Self Care | Payer: 59 | Source: Ambulatory Visit | Attending: Radiation Oncology | Admitting: Radiation Oncology

## 2022-02-13 ENCOUNTER — Telehealth: Payer: Self-pay | Admitting: Genetic Counselor

## 2022-02-13 ENCOUNTER — Encounter: Payer: Self-pay | Admitting: Radiation Oncology

## 2022-02-13 ENCOUNTER — Encounter: Payer: Self-pay | Admitting: Genetic Counselor

## 2022-02-13 ENCOUNTER — Other Ambulatory Visit: Payer: Self-pay

## 2022-02-13 DIAGNOSIS — Z170 Estrogen receptor positive status [ER+]: Secondary | ICD-10-CM

## 2022-02-13 DIAGNOSIS — Z1379 Encounter for other screening for genetic and chromosomal anomalies: Secondary | ICD-10-CM | POA: Insufficient documentation

## 2022-02-13 DIAGNOSIS — C50111 Malignant neoplasm of central portion of right female breast: Secondary | ICD-10-CM

## 2022-02-13 NOTE — Telephone Encounter (Signed)
Revealed negative genetic testing on the [facility] 9 gene STAT panel.  We will get the remainder of the panel back in the next couple weeks and will call that out.

## 2022-02-14 ENCOUNTER — Ambulatory Visit
Admission: RE | Admit: 2022-02-14 | Discharge: 2022-02-14 | Disposition: A | Discharge status: Home / Self Care | Payer: 59 | Source: Ambulatory Visit | Attending: General Surgery | Admitting: General Surgery

## 2022-02-14 ENCOUNTER — Encounter: Payer: Self-pay | Admitting: Radiology

## 2022-02-14 DIAGNOSIS — R9389 Abnormal findings on diagnostic imaging of other specified body structures: Secondary | ICD-10-CM

## 2022-02-14 HISTORY — PX: BREAST BIOPSY: SHX20

## 2022-02-14 MED ORDER — GADOBUTROL 1 MMOL/ML IV SOLN
8.0000 mL | Freq: Once | INTRAVENOUS | Status: AC | PRN
  Administered 2022-02-14: 8 mL via INTRAVENOUS

## 2022-02-18 ENCOUNTER — Encounter: Payer: Self-pay | Admitting: *Deleted

## 2022-02-18 ENCOUNTER — Telehealth: Payer: Self-pay | Admitting: *Deleted

## 2022-02-18 NOTE — Telephone Encounter (Signed)
Received phone call from [facility] and this patient has an appt.with Rad/Onc on 03/03/22 @ 9:30 am for consultation

## 2022-02-21 ENCOUNTER — Other Ambulatory Visit: Payer: Self-pay | Admitting: General Surgery

## 2022-02-21 ENCOUNTER — Telehealth: Payer: Self-pay | Admitting: *Deleted

## 2022-02-21 DIAGNOSIS — C50111 Malignant neoplasm of central portion of right female breast: Secondary | ICD-10-CM

## 2022-02-21 NOTE — Telephone Encounter (Signed)
Spoke to pt regard CT and bone scan results. Informed pt msg sent for update on surgery date ?No further questions at this time ?

## 2022-02-24 ENCOUNTER — Telehealth: Payer: Self-pay | Admitting: Genetic Counselor

## 2022-02-24 ENCOUNTER — Other Ambulatory Visit: Payer: Self-pay | Admitting: *Deleted

## 2022-02-24 ENCOUNTER — Ambulatory Visit: Payer: Self-pay | Admitting: Genetic Counselor

## 2022-02-24 ENCOUNTER — Telehealth: Payer: Self-pay | Admitting: Hematology and Oncology

## 2022-02-24 DIAGNOSIS — Z170 Estrogen receptor positive status [ER+]: Secondary | ICD-10-CM

## 2022-02-24 DIAGNOSIS — Z1379 Encounter for other screening for genetic and chromosomal anomalies: Secondary | ICD-10-CM

## 2022-02-24 NOTE — Telephone Encounter (Signed)
R/s per 3/6 in basket, pt has been called and confirmed  ?

## 2022-02-24 NOTE — Telephone Encounter (Signed)
Revealed negative genetic testing.  Discussed that we do not know why she has breast cancer or why there is cancer in the family. It could be due to a different gene that we are not testing, or maybe our current technology may not be able to pick something up.  It will be important for her to keep in contact with genetics to keep up with whether additional testing may be needed. 

## 2022-02-24 NOTE — Progress Notes (Signed)
HPI:  Renee Schwartz was previously seen in the [facility] due to a personal and family history of cancer and concerns regarding a hereditary predisposition to cancer. Please refer to our prior cancer genetics clinic note for more information regarding our discussion, assessment and recommendations, at the time. Ms. Limbaugh's recent genetic test results were disclosed to her, as were recommendations warranted by these results. These results and recommendations are discussed in more detail below. ° °CANCER HISTORY:  °Oncology History  °Malignant neoplasm of central portion of right breast in female, estrogen receptor positive (HCC)  °01/29/2022 Initial Diagnosis  ° Malignant neoplasm of central portion of right breast in female, estrogen receptor positive (HCC) °  °02/24/2022 Genetic Testing  ° Negative genetic testing on the STAT panel.  The STAT Breast cancer panel offered by [facility] includes sequencing and rearrangement analysis for the following 9 genes:  ATM, BRCA1, BRCA2, CDH1, CHEK2, PALB2, PTEN, STK11 and TP53.   The report date is February 13, 2022. Negative genetic testing on the CancerNext-Expanded+RNAinsight.  CDKN2A VUS identified.  The report date is February 24, 2022. ° °The CancerNext-Expanded gene panel offered by [facility] and includes sequencing and rearrangement analysis for the following 77 genes: AIP, ALK, APC*, ATM*, AXIN2, BAP1, BARD1, BLM, BMPR1A, BRCA1*, BRCA2*, BRIP1*, CDC73, CDH1*, CDK4, CDKN1B, CDKN2A, CHEK2*, CTNNA1, DICER1, FANCC, FH, FLCN, GALNT12, KIF1B, LZTR1, MAX, MEN1, MET, MLH1*, MSH2*, MSH3, MSH6*, MUTYH*, NBN, NF1*, NF2, NTHL1, PALB2*, PHOX2B, PMS2*, POT1, PRKAR1A, PTCH1, PTEN*, RAD51C*, RAD51D*, RB1, RECQL, RET, SDHA, SDHAF2, SDHB, SDHC, SDHD, SMAD4, SMARCA4, SMARCB1, SMARCE1, STK11, SUFU, TMEM127, TP53*, TSC1, TSC2, VHL and XRCC2 (sequencing and deletion/duplication); EGFR, EGLN1, HOXB13, KIT, MITF, PDGFRA, POLD1, and POLE (sequencing only); EPCAM and GREM1  (deletion/duplication only). DNA and RNA analyses performed for * genes.  °  ° ° °FAMILY HISTORY:  °We obtained a detailed, 4-generation family history.  Significant diagnoses are listed below: °Family History  °Problem Relation Age of Onset  ° Cancer Mother 61  °     abdominal cancer; refused treatment - Panc vs colon  ° Huntington's disease Father 42  °     d. 64  ° Non-Hodgkin's lymphoma Brother 45  ° Cancer Maternal Grandmother   °     GYN cancer  ° Breast cancer Maternal Grandmother 65  ° Huntington's disease Paternal Grandmother 48  °     d. 60  ° Wilm's tumor Son 3  °     second dx at 5  ° Breast cancer Other 70  °     dbl mastectomy - MGMs sister  ° ° °  °The patient has a son and daughter.  Her son was diagnosed with wilms tumor at ages 3 and 5. He reportedly has hemihypertrophy and a larger sized tongue with is suggestive of Beckwith Wiedemann syndrome.  She has a brother and sister.  The brother was treated for Non-hodgkin's lymphoma at 45.  Both parents are deceased. °  °The patient's father died at 64 from Huntington's disease.  He ha three sisters and two brothers, none who reportedly are showing signs of HD.  The paternal grandparents are deceased. The grandmother from HD at age 60. °  °The patient's mother died at 61 from either pancreatic or colon cancer.  She has a brother and sister who are cancer free.  The maternal grandparents are deceased. The grandmother had a GYN tumor that ruptured after her last pregnancy and caused her to have a TAH/BSO.  She developed   breast cancer in her late 60's-70's.,  She has several sisters who also had breast cancer. °  °Ms. Vivas is unaware of previous family history of genetic testing for hereditary cancer risks. Patient's maternal ancestors are of Caucasian descent, and paternal ancestors are of [place] descent. There is no reported Ashkenazi Jewish ancestry. There is no known consanguinity ° °GENETIC TEST RESULTS: Genetic testing reported out on February 24, 2022  through the CancerNext-Expanded+RNAinsight cancer panel found no pathogenic mutations.   °  ° ° °We discussed with Ms. Waddell that because current genetic testing is not perfect, it is possible there may be a gene mutation in one of these genes that current testing cannot detect, but that chance is small.  We also discussed, that there could be another gene that has not yet been discovered, or that we have not yet tested, that is responsible for the cancer diagnoses in the family. It is also possible there is a hereditary cause for the cancer in the family that Ms. Kregel did not inherit and therefore was not identified in her testing.  Therefore, it is important to remain in touch with cancer genetics in the future so that we can continue to offer Ms. Doane the most up to date genetic testing.  ° °Genetic testing did identify a variant of uncertain significance (VUS) was identified in the CDKN2A gene called c.59C>T (p.Ala20Val).  At this time, it is unknown if this variant is associated with increased cancer risk or if this is a normal finding, but most variants such as this get reclassified to being inconsequential. It should not be used to make medical management decisions. With time, we suspect the lab will determine the significance of this variant, if any. If we do learn more about it, we will try to contact Ms. Astarita to discuss it further. However, it is important to stay in touch with us periodically and keep the address and phone number up to date. ° °ADDITIONAL GENETIC TESTING: We discussed with Ms. Skop that her genetic testing was fairly extensive.  If there are genes identified to increase cancer risk that can be analyzed in the future, we would be happy to discuss and coordinate this testing at that time.   ° °CANCER SCREENING RECOMMENDATIONS: Ms. Gray's test result is considered negative (normal).  This means that we have not identified a hereditary cause for her personal and family history of cancer at  this time. Most cancers happen by chance and this negative test suggests that her cancer may fall into this category.   ° °While reassuring, this does not definitively rule out a hereditary predisposition to cancer. It is still possible that there could be genetic mutations that are undetectable by current technology. There could be genetic mutations in genes that have not been tested or identified to increase cancer risk.  Therefore, it is recommended she continue to follow the cancer management and screening guidelines provided by her oncology and primary healthcare provider.  ° °An individual's cancer risk and medical management are not determined by genetic test results alone. Overall cancer risk assessment incorporates additional factors, including personal medical history, family history, and any available genetic information that may result in a personalized plan for cancer prevention and surveillance ° °RECOMMENDATIONS FOR FAMILY MEMBERS:  Individuals in this family might be at some increased risk of developing cancer, over the general population risk, simply due to the family history of cancer.  We recommended women in this family have a yearly mammogram   beginning at age 40, or 10 years younger than the earliest onset of cancer, an annual clinical breast exam, and perform monthly breast self-exams. Women in this family should also have a gynecological exam as recommended by their primary provider. All family members should be referred for colonoscopy starting at age 45. ° °FOLLOW-UP: Lastly, we discussed with Ms. Buist that cancer genetics is a rapidly advancing field and it is possible that new genetic tests will be appropriate for her and/or her family members in the future. We encouraged her to remain in contact with cancer genetics on an annual basis so we can update her personal and family histories and let her know of advances in cancer genetics that may benefit this family.  ° °Our contact number was  provided. Ms. Mroczkowski's questions were answered to her satisfaction, and she knows she is welcome to call us at anytime with additional questions or concerns.  ° °Karen Powell, MS, LCGC °Licensed, Certified Genetic Counselor °[email or web address] ° °

## 2022-02-25 ENCOUNTER — Ambulatory Visit: Payer: 59 | Attending: General Surgery | Admitting: Rehabilitation

## 2022-02-25 ENCOUNTER — Other Ambulatory Visit: Payer: Self-pay

## 2022-02-25 ENCOUNTER — Encounter: Payer: Self-pay | Admitting: Rehabilitation

## 2022-02-25 DIAGNOSIS — Z483 Aftercare following surgery for neoplasm: Secondary | ICD-10-CM | POA: Insufficient documentation

## 2022-02-25 DIAGNOSIS — R293 Abnormal posture: Secondary | ICD-10-CM | POA: Insufficient documentation

## 2022-02-25 DIAGNOSIS — Z170 Estrogen receptor positive status [ER+]: Secondary | ICD-10-CM | POA: Diagnosis present

## 2022-02-25 DIAGNOSIS — Z9189 Other specified personal risk factors, not elsewhere classified: Secondary | ICD-10-CM | POA: Diagnosis present

## 2022-02-25 DIAGNOSIS — C50111 Malignant neoplasm of central portion of right female breast: Secondary | ICD-10-CM | POA: Diagnosis present

## 2022-02-25 NOTE — Therapy (Signed)
OUTPATIENT PHYSICAL THERAPY BREAST CANCER BASELINE EVALUATION   Patient Name: Renee Schwartz MRN: [id number] DOB:[birth year], 51 y.o., female Today's Date: 02/25/2022   PT End of Session - 02/25/22 1424     Visit Number 1    Number of Visits 2    Date for PT Re-Evaluation 03/26/22    PT Start Time 1430    PT Stop Time 1500    PT Time Calculation (min) 30 min    Activity Tolerance Patient tolerated treatment well    Behavior During Therapy [facility] for tasks assessed/performed             History reviewed. No pertinent past medical history. Past Surgical History:  Procedure Laterality Date   BREAST BIOPSY Right 02/14/2022   MRI Bx   BREAST BIOPSY Right 01/22/2022   invasive ductal   Patient Active Problem List   Diagnosis Date Noted   Genetic testing 02/13/2022   Family history of breast cancer 02/06/2022   Malignant neoplasm of central portion of right breast in female, estrogen receptor positive ([place]) 01/29/2022    PCP: Bing Neighbors, NP  REFERRING PROVIDER: Stark Klein, MD  REFERRING DIAG: Right breast cancer  THERAPY DIAG:  Malignant neoplasm of central portion of right breast in female, estrogen receptor positive ([place])  Abnormal posture  At risk for lymphedema  ONSET DATE: 02/19/22  SUBJECTIVE                                                                                                                                                                                           SUBJECTIVE STATEMENT: Patient reports she is here today to be seen by her medical team for her newly diagnosed right breast cancer.   PERTINENT HISTORY:  Patient was diagnosed on Rt breast cancer and will be having a lumpectomy and SLNB on 03/05/22 with Dr. Barry Dienes.   PatIENT GOALS   reduce lymphedema risk and learn post op HEP.   PAIN:  Are you having pain? No  PRECAUTIONS: Active CA   HAND DOMINANCE: right  WEIGHT BEARING RESTRICTIONS No  FALLS:  Has patient fallen in last  6 months? 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    LEISURE: no  PRIOR LEVEL OF FUNCTION: Independent   OBJECTIVE  COGNITION:  Overall cognitive status: Within functional limits for tasks assessed    POSTURE:  Forward head and rounded shoulders posture  UPPER EXTREMITY AROM/PROM:  A/PROM RIGHT  02/25/2022   Shoulder extension 60  Shoulder flexion 163  Shoulder abduction 170  Shoulder internal rotation 85  Shoulder external rotation 90    (Blank rows = not tested)   CERVICAL AROM: All within normal limits:   UPPER EXTREMITY STRENGTH: 5/5 bil   LYMPHEDEMA ASSESSMENTS:   LANDMARK RIGHT  02/25/2022  10 cm proximal to olecranon process 29  Olecranon process 26  10 cm proximal to ulnar styloid process 21.2  Just proximal to ulnar styloid process 16.2  Across hand at thumb web space 19.5  At base of 2nd digit 6.2  (Blank rows = not tested)    L-DEX LYMPHEDEMA SCREENING:  The patient was assessed using the L-Dex machine today to produce a lymphedema index baseline score. The patient will be reassessed on a regular basis (typically every 3 months) to obtain new L-Dex scores. If the score is > 6.5 points away from his/her baseline score indicating onset of subclinical lymphedema, it will be recommended to wear a compression garment for 4 weeks, 12 hours per day and then be reassessed. If the score continues to be > 6.5 points from baseline at reassessment, we will initiate lymphedema treatment. Assessing in this manner has a 95% rate of preventing clinically significant lymphedema.   L-DEX FLOWSHEETS - 02/25/22 1500       L-DEX LYMPHEDEMA SCREENING   Measurement Type Unilateral    L-DEX MEASUREMENT EXTREMITY Upper Extremity    POSITION  Standing    DOMINANT SIDE Right    At Risk Side Right    BASELINE SCORE  (UNILATERAL) -5.4              QUICK DASH SURVEY: 4.5%   PATIENT EDUCATION:  Education details: Lymphedema risk reduction and post op shoulder/posture HEP Person educated: Patient Education method: Explanation, Demonstration, Handout Education comprehension: Patient verbalized understanding and returned demonstration   HOME EXERCISE PROGRAM: Patient was instructed today in a home exercise program today for post op shoulder range of motion. These included active assist shoulder flexion in sitting, scapular retraction, wall walking with shoulder abduction, and hands behind head external rotation.  She was encouraged to do these twice a day, holding 3 seconds and repeating 5 times when permitted by her physician.   ASSESSMENT:  CLINICAL IMPRESSION: She will benefit from a post op PT reassessment to determine needs and from L-Dex screens every 3 months for 2 years to detect subclinical lymphedema.  Pt will benefit from skilled therapeutic intervention to improve on the following deficits: Decreased knowledge of precautions, impaired UE functional use, pain, decreased ROM, postural dysfunction.   PT treatment/interventions: ADL/self-care home management, pt/family education, therapeutic exercise  REHAB POTENTIAL: Excellent  CLINICAL DECISION MAKING: Stable/uncomplicated  EVALUATION COMPLEXITY: Low   GOALS: Goals reviewed with patient? YES  LONG TERM GOALS: (STG=LTG)   Name Target Date Goal status  1 Pt will be able to verbalize understanding of pertinent lymphedema risk reduction practices relevant to her dx specifically related to skin care.  Baseline:  No knowledge 02/25/2022 Achieved at eval  2 Pt will be able to return demo and/or verbalize understanding of the post op HEP related to regaining shoulder ROM. Baseline:  No knowledge 02/25/2022 Achieved at eval  3 Pt will be able to verbalize understanding of the importance of attending the post op After Breast CA Class for  further lymphedema risk reduction education and therapeutic exercise.  Baseline:  No knowledge 02/25/2022 Achieved at eval  4 Pt will demo she has regained full shoulder ROM and function post operatively compared  to baselines.  Baseline: See objective measurements taken today. 03/26/22      PLAN: PT FREQUENCY/DURATION: EVAL and 1 follow up appointment.   PLAN FOR NEXT SESSION: will reassess 3-4 weeks post op to determine needs.   Patient will follow up at outpatient cancer rehab 3-4 weeks following surgery.  If the patient requires physical therapy at that time, a specific plan will be dictated and sent to the referring physician for approval. The patient was educated today on appropriate basic range of motion exercises to begin post operatively and the importance of attending the After Breast Cancer class following surgery.  Patient was educated today on lymphedema risk reduction practices as it pertains to recommendations that will benefit the patient immediately following surgery.  She verbalized good understanding.    Physical Therapy Information for After Breast Cancer Surgery/Treatment:  Lymphedema is a swelling condition that you may be at risk for in your arm if you have lymph nodes removed from the armpit area.  After a sentinel node biopsy, the risk is approximately 5-9% and is higher after an axillary node dissection.  There is treatment available for this condition and it is not life-threatening.  Contact your physician or physical therapist with concerns. You may begin the 4 shoulder/posture exercises (see additional sheet) when permitted by your physician (typically a week after surgery).  If you have drains, you may need to wait until those are removed before beginning range of motion exercises.  A general recommendation is to not lift your arms above shoulder height until drains are removed.  These exercises should be done to your tolerance and gently.  This is not a "no pain/no gain"  type of recovery so listen to your body and stretch into the range of motion that you can tolerate, stopping if you have pain.  If you are having immediate reconstruction, ask your plastic surgeon about doing exercises as he or she may want you to wait. We encourage you to attend the free one time ABC (After Breast Cancer) class offered by [facility].  You will learn information related to lymphedema risk, prevention and treatment and additional exercises to regain mobility following surgery.  You can call [phone] for more information.  This is offered the 1st and 3rd Monday of each month.  You only attend the class one time. While undergoing any medical procedure or treatment, try to avoid blood pressure being taken or needle sticks from occurring on the arm on the side of cancer.   This recommendation begins after surgery and continues for the rest of your life.  This may help reduce your risk of getting lymphedema (swelling in your arm). An excellent resource for those seeking information on lymphedema is the National Lymphedema Network's web site. It can be accessed at [place].org If you notice swelling in your hand, arm or breast at any time following surgery (even if it is many years from now), please contact your doctor or physical therapist to discuss this.  Lymphedema can be treated at any time but it is easier for you if it is treated early on.  If you feel like your shoulder motion is not returning to normal in a reasonable amount of time, please contact your surgeon or physical therapist.  [facility] [phone]. [address], [address], [place] [address]  ABC CLASS After Breast Cancer Class  After Breast Cancer Class is a specially designed exercise class to assist you in a  safe recover after having breast cancer surgery.  In this class you will learn how to get back to full function whether your drains were just  removed or if you had surgery a month ago.  This one-time class is held the 1st and 3rd Monday of every month from 11:00 a.m. until 12:00 noon virtually.  This class is FREE and space is limited. For more information or to register for the next available class, call [phone].  Class Goals  Understand specific stretches to improve the flexibility of you chest and shoulder. Learn ways to safely strengthen your upper body and improve your posture. Understand the warning signs of infection and why you may be at risk for an arm infection. Learn about Lymphedema and prevention.  ** You do not attend this class until after surgery.  Drains must be removed to participate  Patient was instructed today in a home exercise program today for post op shoulder range of motion. These included active assist shoulder flexion in sitting, scapular retraction, wall walking with shoulder abduction, and hands behind head external rotation.  She was encouraged to do these twice a day, holding 3 seconds and repeating 5 times when permitted by her physician.    Stark Bray, PT 02/25/2022, 3:05 PM

## 2022-02-26 NOTE — Pre-Procedure Instructions (Signed)
Surgical Instructions ? ? ? Your procedure is scheduled on Wednesday 03/05/22. ? ? Report to [facility] Main Entrance "A" at 11:00 A.M., then check in with the Admitting office. ? Call this number if you have problems the morning of surgery: ? [phone] ? ? If you have any questions prior to your surgery date call [phone]: Open Monday-Friday 8am-4pm ? ? ? Remember: ? Do not eat after midnight the night before your surgery ? ?You may drink clear liquids until 10:00 A.M. the morning of your surgery.   ?Clear liquids allowed are: Water, Non-Citrus Juices (without pulp), Carbonated Beverages, Clear Tea, Black Coffee Only (NO MILK, CREAM OR POWDERED CREAMER of any kind), and Gatorade. ?  ? Take these medicines the morning of surgery with A SIP OF WATER  ? NONE ? ? ?As of today, STOP taking any Aspirin (unless otherwise instructed by your surgeon) Aleve, Naproxen, Ibuprofen, Motrin, Advil, Goody's, BC's, all herbal medications, fish oil, and all vitamins. ?         ?           ?Do NOT Smoke (Tobacco/Vaping) for 24 hours prior to your procedure. ? ?If you use a CPAP at night, you may bring your mask/headgear for your overnight stay. ?  ?Contacts, glasses, piercing's, hearing aid's, dentures or partials may not be worn into surgery, please bring cases for these belongings.  ?  ?For patients admitted to the hospital, discharge time will be determined by your treatment team. ?  ?Patients discharged the day of surgery will not be allowed to drive home, and someone needs to stay with them for 24 hours. ? ?NO VISITORS WILL BE ALLOWED IN PRE-OP WHERE PATIENTS ARE PREPPED FOR SURGERY.  ONLY 1 SUPPORT PERSON MAY BE PRESENT IN THE WAITING ROOM WHILE YOU ARE IN SURGERY.  IF YOU ARE TO BE ADMITTED, ONCE YOU ARE IN YOUR ROOM YOU WILL BE ALLOWED TWO (2) VISITORS. (1) VISITOR MAY STAY OVERNIGHT BUT MUST ARRIVE TO THE ROOM BY 8pm.  Minor children may have two parents present. Special consideration for safety and communication needs  will be reviewed on a case by case basis. ? ? ?Special instructions:   ?- Preparing For Surgery ? ?Before surgery, you can play an important role. Because skin is not sterile, your skin needs to be as free of germs as possible. You can reduce the number of germs on your skin by washing with CHG (chlorahexidine gluconate) Soap before surgery.  CHG is an antiseptic cleaner which kills germs and bonds with the skin to continue killing germs even after washing.   ? ?Oral Hygiene is also important to reduce your risk of infection.  Remember - BRUSH YOUR TEETH THE MORNING OF SURGERY WITH YOUR REGULAR TOOTHPASTE ? ?Please do not use if you have an allergy to CHG or antibacterial soaps. If your skin becomes reddened/irritated stop using the CHG.  ?Do not shave (including legs and underarms) for at least 48 hours prior to first CHG shower. It is OK to shave your face. ? ?Please follow these instructions carefully. ?  ?Shower the NIGHT BEFORE SURGERY and the MORNING OF SURGERY ? ?If you chose to wash your hair, wash your hair first as usual with your normal shampoo. ? ?After you shampoo, rinse your hair and body thoroughly to remove the shampoo. ? ?Use CHG Soap as you would any other liquid soap. You can apply CHG directly to the skin and wash gently with a scrungie or a  clean washcloth.  ? ?Apply the CHG Soap to your body ONLY FROM THE NECK DOWN.  Do not use on open wounds or open sores. Avoid contact with your eyes, ears, mouth and genitals (private parts). Wash Face and genitals (private parts)  with your normal soap.  ? ?Wash thoroughly, paying special attention to the area where your surgery will be performed. ? ?Thoroughly rinse your body with warm water from the neck down. ? ?DO NOT shower/wash with your normal soap after using and rinsing off the CHG Soap. ? ?Pat yourself dry with a CLEAN TOWEL. ? ?Wear CLEAN PAJAMAS to bed the night before surgery ? ?Place CLEAN SHEETS on your bed the night before your  surgery ? ?DO NOT SLEEP WITH PETS. ? ? ?Day of Surgery: ?Take a shower with CHG soap. ?Do not wear jewelry or makeup ?Do not wear lotions, powders, perfumes/colognes, or deodorant. ?Do not shave 48 hours prior to surgery.  Men may shave face and neck. ?Do not bring valuables to the hospital.  ?[facility] is not responsible for any belongings or valuables. ?Do not wear nail polish, gel polish, artificial nails, or any other type of covering on natural nails (fingers and toes) ?If you have artificial nails or gel coating that need to be removed by a nail salon, please have this removed prior to surgery. Artificial nails or gel coating may interfere with anesthesia's ability to adequately monitor your vital signs. ?Wear Clean/Comfortable clothing the morning of surgery ?Do not apply any deodorants/lotions.   ?Remember to brush your teeth WITH YOUR REGULAR TOOTHPASTE. ?  ?Please read over the following fact sheets that you were given. ? ? ?3 days prior to your procedure or After your COVID test ? ?? You are not required to quarantine however you are required to wear a well-fitting mask when you are out and around people not in your household. If your mask becomes wet or soiled, replace with a new one. ? ?? Wash your hands often with soap and water for 20 seconds or clean your hands with an alcohol-based hand sanitizer that contains at least 60% alcohol. ? ?? Do not share personal items. ? ?? Notify your provider: ? ?o if you are in close contact with someone who has COVID ? ?o or if you develop a fever of 100.4 or greater, sneezing, cough, sore throat, shortness of breath or body aches.  ? ?

## 2022-02-27 ENCOUNTER — Encounter: Payer: Self-pay | Admitting: *Deleted

## 2022-02-27 ENCOUNTER — Other Ambulatory Visit: Payer: Self-pay

## 2022-02-27 ENCOUNTER — Encounter (HOSPITAL_COMMUNITY): Payer: Self-pay

## 2022-02-27 ENCOUNTER — Encounter (HOSPITAL_COMMUNITY)
Admission: RE | Admit: 2022-02-27 | Discharge: 2022-02-27 | Disposition: A | Discharge status: Home / Self Care | Payer: 59 | Source: Ambulatory Visit | Attending: General Surgery | Admitting: General Surgery

## 2022-02-27 DIAGNOSIS — Z01812 Encounter for preprocedural laboratory examination: Secondary | ICD-10-CM | POA: Diagnosis not present

## 2022-02-27 HISTORY — DX: Anxiety disorder, unspecified: F41.9

## 2022-02-27 LAB — POCT PREGNANCY, URINE: Preg Test, Ur: NEGATIVE

## 2022-02-27 NOTE — Progress Notes (Signed)
PCP - Lajuana Carry NP ?Cardiologist - Denies ?Oncologist: Dr. Arletha Pili Iruku ? ?PPM/ICD - Denies ? ?Chest x-ray - N/A ?EKG - N/A ?Stress Test - Denies ?ECHO - Denies ?Cardiac Cath - Denies ? ?Sleep Study - Denies ? ?Patient denies having diabetes. ? ?Blood Thinner Instructions: N/A ?Aspirin Instructions: N/A ? ?ERAS Protcol - Yes ?PRE-SURGERY Ensure or G2- No ? ?COVID TEST- N/A ? ? ?Anesthesia review: Yes, seed placement ? ?Patient denies shortness of breath, fever, cough and chest pain at PAT appointment ? ? ?All instructions explained to the patient, with a verbal understanding of the material. Patient agrees to go over the instructions while at home for a better understanding. Patient also instructed to self quarantine after being tested for COVID-19. The opportunity to ask questions was provided. ? ? ?

## 2022-03-04 ENCOUNTER — Ambulatory Visit
Admission: RE | Admit: 2022-03-04 | Discharge: 2022-03-04 | Disposition: A | Discharge status: Home / Self Care | Payer: 59 | Source: Ambulatory Visit | Attending: General Surgery | Admitting: General Surgery

## 2022-03-04 DIAGNOSIS — C50111 Malignant neoplasm of central portion of right female breast: Secondary | ICD-10-CM

## 2022-03-05 ENCOUNTER — Other Ambulatory Visit: Payer: Self-pay

## 2022-03-05 ENCOUNTER — Ambulatory Visit (HOSPITAL_COMMUNITY)
Admission: RE | Admit: 2022-03-05 | Discharge: 2022-03-05 | Disposition: A | Discharge status: Home / Self Care | Payer: 59 | Source: Ambulatory Visit | Attending: General Surgery | Admitting: General Surgery

## 2022-03-05 ENCOUNTER — Encounter (HOSPITAL_COMMUNITY): Payer: Self-pay | Admitting: General Surgery

## 2022-03-05 ENCOUNTER — Encounter (HOSPITAL_COMMUNITY)
Admission: RE | Disposition: A | Discharge status: Home / Self Care | Payer: Self-pay | Source: Ambulatory Visit | Attending: General Surgery

## 2022-03-05 ENCOUNTER — Ambulatory Visit
Admission: RE | Admit: 2022-03-05 | Discharge: 2022-03-05 | Disposition: A | Discharge status: Home / Self Care | Payer: 59 | Source: Ambulatory Visit | Attending: General Surgery | Admitting: General Surgery

## 2022-03-05 ENCOUNTER — Ambulatory Visit (HOSPITAL_COMMUNITY): Payer: 59 | Admitting: Physician Assistant

## 2022-03-05 ENCOUNTER — Ambulatory Visit (HOSPITAL_BASED_OUTPATIENT_CLINIC_OR_DEPARTMENT_OTHER): Payer: 59 | Admitting: Anesthesiology

## 2022-03-05 DIAGNOSIS — F17210 Nicotine dependence, cigarettes, uncomplicated: Secondary | ICD-10-CM | POA: Insufficient documentation

## 2022-03-05 DIAGNOSIS — C50111 Malignant neoplasm of central portion of right female breast: Secondary | ICD-10-CM | POA: Diagnosis not present

## 2022-03-05 DIAGNOSIS — Z170 Estrogen receptor positive status [ER+]: Secondary | ICD-10-CM

## 2022-03-05 DIAGNOSIS — N6011 Diffuse cystic mastopathy of right breast: Secondary | ICD-10-CM | POA: Insufficient documentation

## 2022-03-05 DIAGNOSIS — Z08 Encounter for follow-up examination after completed treatment for malignant neoplasm: Secondary | ICD-10-CM | POA: Insufficient documentation

## 2022-03-05 DIAGNOSIS — N6489 Other specified disorders of breast: Secondary | ICD-10-CM | POA: Insufficient documentation

## 2022-03-05 DIAGNOSIS — K5900 Constipation, unspecified: Secondary | ICD-10-CM | POA: Diagnosis not present

## 2022-03-05 DIAGNOSIS — Z853 Personal history of malignant neoplasm of breast: Secondary | ICD-10-CM | POA: Diagnosis not present

## 2022-03-05 DIAGNOSIS — N6081 Other benign mammary dysplasias of right breast: Secondary | ICD-10-CM | POA: Insufficient documentation

## 2022-03-05 HISTORY — PX: BREAST LUMPECTOMY: SHX2

## 2022-03-05 SURGERY — BREAST LUMPECTOMY WITH RADIOACTIVE SEED AND SENTINEL LYMPH NODE BIOPSY
Anesthesia: General | Site: Breast | Laterality: Right

## 2022-03-05 MED ORDER — FENTANYL CITRATE (PF) 100 MCG/2ML IJ SOLN
INTRAMUSCULAR | Status: AC
  Administered 2022-03-05: 50 ug
  Filled 2022-03-05: qty 2

## 2022-03-05 MED ORDER — ONDANSETRON HCL 4 MG/2ML IJ SOLN
INTRAMUSCULAR | Status: DC | PRN
  Administered 2022-03-05: 4 mg via INTRAVENOUS

## 2022-03-05 MED ORDER — BUPIVACAINE HCL (PF) 0.5 % IJ SOLN
INTRAMUSCULAR | Status: DC | PRN
  Administered 2022-03-05: 20 mL via PERINEURAL

## 2022-03-05 MED ORDER — CIPROFLOXACIN IN D5W 400 MG/200ML IV SOLN
400.0000 mg | INTRAVENOUS | Status: AC
  Administered 2022-03-05: 400 mg via INTRAVENOUS

## 2022-03-05 MED ORDER — OXYCODONE HCL 5 MG PO TABS
ORAL_TABLET | ORAL | Status: AC
  Filled 2022-03-05: qty 1

## 2022-03-05 MED ORDER — FENTANYL CITRATE (PF) 250 MCG/5ML IJ SOLN
INTRAMUSCULAR | Status: AC
  Filled 2022-03-05: qty 5

## 2022-03-05 MED ORDER — LIDOCAINE 2% (20 MG/ML) 5 ML SYRINGE
INTRAMUSCULAR | Status: DC | PRN
  Administered 2022-03-05: 60 mg via INTRAVENOUS

## 2022-03-05 MED ORDER — GABAPENTIN 300 MG PO CAPS
ORAL_CAPSULE | ORAL | Status: AC
  Administered 2022-03-05: 300 mg via ORAL
  Filled 2022-03-05: qty 1

## 2022-03-05 MED ORDER — PHENYLEPHRINE HCL-NACL 20-0.9 MG/250ML-% IV SOLN
INTRAVENOUS | Status: DC | PRN
  Administered 2022-03-05: 25 ug/min via INTRAVENOUS

## 2022-03-05 MED ORDER — MIDAZOLAM HCL 2 MG/2ML IJ SOLN
INTRAMUSCULAR | Status: AC
  Filled 2022-03-05: qty 2

## 2022-03-05 MED ORDER — PROPOFOL 500 MG/50ML IV EMUL
INTRAVENOUS | Status: DC | PRN
  Administered 2022-03-05: 25 ug/kg/min via INTRAVENOUS

## 2022-03-05 MED ORDER — ORAL CARE MOUTH RINSE: 15.0000 mL | Freq: Once | OROMUCOSAL | Status: AC

## 2022-03-05 MED ORDER — CHLORHEXIDINE GLUCONATE 0.12 % MT SOLN: 15.0000 mL | Freq: Once | OROMUCOSAL | Status: AC

## 2022-03-05 MED ORDER — LACTATED RINGERS IV SOLN: INTRAVENOUS | Status: DC

## 2022-03-05 MED ORDER — CHLORHEXIDINE GLUCONATE CLOTH 2 % EX PADS: 6.0000 | MEDICATED_PAD | Freq: Once | CUTANEOUS | Status: DC

## 2022-03-05 MED ORDER — MIDAZOLAM HCL 2 MG/2ML IJ SOLN
2.0000 mg | Freq: Once | INTRAMUSCULAR | Status: AC
  Filled 2022-03-05: qty 2

## 2022-03-05 MED ORDER — CIPROFLOXACIN IN D5W 400 MG/200ML IV SOLN
INTRAVENOUS | Status: AC
  Filled 2022-03-05: qty 200

## 2022-03-05 MED ORDER — OXYCODONE HCL 5 MG PO TABS: 5.0000 mg | ORAL_TABLET | Freq: Four times a day (QID) | ORAL | 0 refills | Status: AC | PRN

## 2022-03-05 MED ORDER — MIDAZOLAM HCL 2 MG/2ML IJ SOLN
INTRAMUSCULAR | Status: AC
  Administered 2022-03-05: 2 mg via INTRAVENOUS
  Filled 2022-03-05: qty 2

## 2022-03-05 MED ORDER — 0.9 % SODIUM CHLORIDE (POUR BTL) OPTIME
TOPICAL | Status: DC | PRN
  Administered 2022-03-05: 1000 mL

## 2022-03-05 MED ORDER — BUPIVACAINE-EPINEPHRINE (PF) 0.25% -1:200000 IJ SOLN
INTRAMUSCULAR | Status: AC
  Filled 2022-03-05: qty 30

## 2022-03-05 MED ORDER — HYDROMORPHONE HCL 1 MG/ML IJ SOLN: 0.2500 mg | INTRAMUSCULAR | Status: DC | PRN

## 2022-03-05 MED ORDER — FENTANYL CITRATE (PF) 250 MCG/5ML IJ SOLN
INTRAMUSCULAR | Status: DC | PRN
  Administered 2022-03-05: 50 ug via INTRAVENOUS

## 2022-03-05 MED ORDER — LIDOCAINE HCL (PF) 1 % IJ SOLN
INTRAMUSCULAR | Status: AC
  Filled 2022-03-05: qty 30

## 2022-03-05 MED ORDER — OXYCODONE HCL 5 MG PO TABS
5.0000 mg | ORAL_TABLET | Freq: Once | ORAL | Status: AC | PRN
  Administered 2022-03-05: 5 mg via ORAL

## 2022-03-05 MED ORDER — FENTANYL CITRATE PF 50 MCG/ML IJ SOSY
50.0000 ug | PREFILLED_SYRINGE | Freq: Once | INTRAMUSCULAR | Status: DC
  Filled 2022-03-05: qty 1

## 2022-03-05 MED ORDER — GABAPENTIN 300 MG PO CAPS: 300.0000 mg | ORAL_CAPSULE | ORAL | Status: AC

## 2022-03-05 MED ORDER — LIDOCAINE HCL 1 % IJ SOLN
INTRAMUSCULAR | Status: DC | PRN
  Administered 2022-03-05: 15 mL

## 2022-03-05 MED ORDER — PROPOFOL 10 MG/ML IV BOLUS
INTRAVENOUS | Status: DC | PRN
  Administered 2022-03-05: 160 mg via INTRAVENOUS
  Administered 2022-03-05: 50 mg via INTRAVENOUS

## 2022-03-05 MED ORDER — OXYCODONE HCL 5 MG/5ML PO SOLN: 5.0000 mg | Freq: Once | ORAL | Status: AC | PRN

## 2022-03-05 MED ORDER — BUPIVACAINE LIPOSOME 1.3 % IJ SUSP
INTRAMUSCULAR | Status: DC | PRN
  Administered 2022-03-05: 10 mL via PERINEURAL

## 2022-03-05 MED ORDER — DEXAMETHASONE SODIUM PHOSPHATE 10 MG/ML IJ SOLN
INTRAMUSCULAR | Status: DC | PRN
Start: 2022-03-05 — End: 2022-03-05
  Administered 2022-03-05: 10 mg via INTRAVENOUS

## 2022-03-05 MED ORDER — CHLORHEXIDINE GLUCONATE 0.12 % MT SOLN
OROMUCOSAL | Status: AC
  Administered 2022-03-05: 15 mL via OROMUCOSAL
  Filled 2022-03-05: qty 15

## 2022-03-05 MED ORDER — ONDANSETRON HCL 4 MG/2ML IJ SOLN: 4.0000 mg | Freq: Once | INTRAMUSCULAR | Status: DC | PRN

## 2022-03-05 MED ORDER — ACETAMINOPHEN 500 MG PO TABS
1000.0000 mg | ORAL_TABLET | Freq: Once | ORAL | Status: AC
  Administered 2022-03-05: 1000 mg via ORAL
  Filled 2022-03-05: qty 2

## 2022-03-05 MED ORDER — MAGTRACE LYMPHATIC TRACER
INTRAMUSCULAR | Status: DC | PRN
  Administered 2022-03-05: 2 mL via INTRAMUSCULAR

## 2022-03-05 SURGICAL SUPPLY — 44 items
BAG COUNTER SPONGE SURGICOUNT (BAG) ×2 IMPLANT
BINDER BREAST LRG (GAUZE/BANDAGES/DRESSINGS) ×1 IMPLANT
BINDER BREAST XLRG (GAUZE/BANDAGES/DRESSINGS) IMPLANT
BNDG COHESIVE 4X5 TAN STRL (GAUZE/BANDAGES/DRESSINGS) ×2 IMPLANT
CANISTER SUCT 3000ML PPV (MISCELLANEOUS) ×2 IMPLANT
CHLORAPREP W/TINT 26 (MISCELLANEOUS) ×2 IMPLANT
CLIP TI LARGE 6 (CLIP) ×2 IMPLANT
CLIP TI MEDIUM 24 (CLIP) ×2 IMPLANT
CNTNR URN SCR LID CUP LEK RST (MISCELLANEOUS) IMPLANT
CONT SPEC 4OZ STRL OR WHT (MISCELLANEOUS)
COVER PROBE W GEL 5X96 (DRAPES) ×3 IMPLANT
COVER SURGICAL LIGHT HANDLE (MISCELLANEOUS) ×2 IMPLANT
DERMABOND ADVANCED (GAUZE/BANDAGES/DRESSINGS) ×1
DERMABOND ADVANCED .7 DNX12 (GAUZE/BANDAGES/DRESSINGS) ×1 IMPLANT
DEVICE DUBIN SPECIMEN MAMMOGRA (MISCELLANEOUS) ×1 IMPLANT
DRAPE CHEST BREAST 15X10 FENES (DRAPES) ×2 IMPLANT
DRAPE SURG 17X23 STRL (DRAPES) IMPLANT
ELECT COATED BLADE 2.86 ST (ELECTRODE) ×2 IMPLANT
ELECT REM PT RETURN 9FT ADLT (ELECTROSURGICAL) ×2
ELECTRODE REM PT RTRN 9FT ADLT (ELECTROSURGICAL) ×1 IMPLANT
GLOVE SURG ENC MOIS LTX SZ6 (GLOVE) ×1 IMPLANT
GLOVE SURG UNDER LTX SZ6.5 (GLOVE) ×1 IMPLANT
GOWN STRL REUS W/ TWL LRG LVL3 (GOWN DISPOSABLE) ×1 IMPLANT
GOWN STRL REUS W/TWL 2XL LVL3 (GOWN DISPOSABLE) ×2 IMPLANT
GOWN STRL REUS W/TWL LRG LVL3 (GOWN DISPOSABLE) ×1
KIT BASIN OR (CUSTOM PROCEDURE TRAY) ×2 IMPLANT
KIT MARKER MARGIN INK (KITS) ×2 IMPLANT
LIGHT WAVEGUIDE WIDE FLAT (MISCELLANEOUS) IMPLANT
NDL 18GX1X1/2 (RX/OR ONLY) (NEEDLE) IMPLANT
NDL FILTER BLUNT 18X1 1/2 (NEEDLE) IMPLANT
NDL HYPO 25GX1X1/2 BEV (NEEDLE) ×1 IMPLANT
NEEDLE 18GX1X1/2 (RX/OR ONLY) (NEEDLE) ×2 IMPLANT
NEEDLE FILTER BLUNT 18X 1/2SAF (NEEDLE)
NEEDLE FILTER BLUNT 18X1 1/2 (NEEDLE) IMPLANT
NEEDLE HYPO 25GX1X1/2 BEV (NEEDLE) ×4 IMPLANT
NS IRRIG 1000ML POUR BTL (IV SOLUTION) ×2 IMPLANT
PACK GENERAL/GYN (CUSTOM PROCEDURE TRAY) ×2 IMPLANT
PACK UNIVERSAL I (CUSTOM PROCEDURE TRAY) ×2 IMPLANT
STOCKINETTE IMPERVIOUS 9X36 MD (GAUZE/BANDAGES/DRESSINGS) ×2 IMPLANT
SUT MNCRL AB 4-0 PS2 18 (SUTURE) ×3 IMPLANT
SUT VIC AB 3-0 SH 8-18 (SUTURE) ×2 IMPLANT
SYR CONTROL 10ML LL (SYRINGE) ×3 IMPLANT
TOWEL GREEN STERILE (TOWEL DISPOSABLE) ×2 IMPLANT
TOWEL GREEN STERILE FF (TOWEL DISPOSABLE) ×2 IMPLANT

## 2022-03-05 NOTE — H&P (Signed)
?REFERRING PHYSICIAN: Peggye Fothergill, MD ? ?PROVIDER: Georgianne Fick, MD ? ?Care Team: Patient Care Team: ?Fnp, Vilinda Blanks. Bradner as PCP - General ?Artelia Laroche, CNM (Gynecology)  ? ?MRN: [id number] ?DOB: 12/01/1971 ? ?Subjective  ? ?Chief Complaint: New Patient (New breast ca rt ) ? ? ?History of Present Illness: ?Renee Schwartz is a 51 y.o. female who is seen today as an office consultation at the request of Artelia Laroche CNM for evaluation of New Patient (New breast ca rt ) ?.  ? ?Pt presents with a new diagnosis of right breast cancer 01/2022. She had screening detected architectural distortion on the right. She underwent diagnostic imaging which confirmed this. No mass was seen on ultrasound and breast density is C. 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 She did not have the axilla evaluated with ultrasound.  ? ?She has no personal history of cancer before this. However, her son had wilm's tumor, her brother has non hodgkin's lymphoma, and mother likely had some form of abdominal cancer (significant weight loss, abdominal pain, back pain, refused workup), and her grandmother had breast cancer as well as some form of gyn cancer.  ? ?She has unusual pain in her right lower abdomen/tailbone region that is constant, but worsens significantly when she tries to have a bowel movement. She was constipated, but has fixed that with increased fiber and magnesium and still has the pain. It is debilitating when it occurs. ? ?She is accompanied by her husband who is a Administrator.  ? ?Diagnostic mammogram: done at [facility] 11/06/2021 ?Architectural distortion upper center right breast. No sonographic correlate.  ? ?Pathology core needle biopsy: 01/22/2022 ?- INVASIVE DUCTAL CARCINOMA ?Based on the biopsy, the carcinoma appears [place] ?grade 1 of 3 and measures 0.3 cm in greatest linear extent. ? ?Receptors: ?Estrogen Receptor: 95%, POSITIVE, MODERATE  STAINING INTENSITY ?Progesterone Receptor: 100%, POSITIVE, STRONG STAINING INTENSITY ?Proliferation Marker Ki67: 1% ?GROUP 5: HER2 **NEGATIVE** ? ?Review of Systems: ?A complete review of systems was obtained from the patient. I have reviewed this information and discussed as appropriate with the patient. See HPI as well for other ROS. ? ?Review of Systems  ?Respiratory: Positive for cough.  ?Gastrointestinal: Positive for constipation and heartburn.  ?Neurological: Positive for headaches.  ?Endo/Heme/Allergies: Bruises/bleeds easily.  ?Psychiatric/Behavioral: The patient is nervous/anxious.  ?All other systems reviewed and are negative. ? ? ?Medical History: ?Past Medical History:  ?Diagnosis Date  ? Anxiety  ? History of cancer  ? ?Patient Active Problem List  ?Diagnosis  ? Malignant neoplasm of central portion of right breast in female, estrogen receptor positive (CMS-HCC)  ? Sacral pain  ? ?History reviewed. No pertinent surgical history.  ? ?Allergies  ?Allergen Reactions  ? Lisinopril Cough  ? Penicillins Rash  ? ?Current Outpatient Medications on File Prior to Visit  ?Medication Sig Dispense Refill  ? ergocalciferol, vitamin D2, 1,250 mcg (50,000 unit) capsule Take 50,000 Units by mouth once a week  ? ?No current facility-administered medications on file prior to visit.  ? ?Family History  ?Problem Relation Age of Onset  ? Coronary Artery Disease (Blocked arteries around heart) Mother  ? Diabetes Mother  ? ? ?Social History  ? ?Tobacco Use  ?Smoking Status Every Day  ? Packs/day: 1.00  ? Types: Cigarettes  ?Smokeless Tobacco Current  ? ? ?Social History  ? ?Socioeconomic History  ? Marital status: Married  ?Tobacco Use  ? Smoking status:  Every Day  ?Packs/day: 1.00  ?Types: Cigarettes  ? Smokeless tobacco: Current  ?Substance and Sexual Activity  ? Alcohol use: Defer  ? Drug use: Defer  ? ?Objective:  ? ?Vitals:  ?BP: 122/76  ?Pulse: 92  ?Temp: 36.8 ?C (98.2 ?F)  ?SpO2: 98%  ?Weight: 79.4 kg (175 lb)   ?Height: 175.3 cm ($RemoveB'5\' 9"'YrNxmQgV$ )  ? ?Body mass index is 25.84 kg/m?. ? ?Gen: No acute distress. Well nourished and well groomed.  ?Neurological: Alert and oriented to person, place, and time. Coordination normal.  ?Head: Normocephalic and atraumatic.  ?Eyes: Conjunctivae are normal. Pupils are equal, round, and reactive to light. No scleral icterus.  ?Neck: Normal range of motion. Neck supple. No tracheal deviation or thyromegaly present.  ?Cardiovascular: Normal rate, regular rhythm, normal heart sounds and intact distal pulses. Exam reveals no gallop and no friction rub. No murmur heard. ?Breast: mild ptosis bilaterally. Right breast smaller than left. No palpable masses in either breast. Small hematoma superior right breast at biopsy site. No LAD appreciated. No nipple retraction, skin dimpling, nipple discharge.  ?Respiratory: Effort normal. No respiratory distress. No chest wall tenderness. Breath sounds normal. No wheezes, rales or rhonchi.  ?GI: Soft. Bowel sounds are normal. The abdomen is soft and nontender. There is no rebound and no guarding.  ?Musculoskeletal: Normal range of motion. Extremities are nontender.  ?Lymphadenopathy: No cervical, preauricular, postauricular or axillary adenopathy is present ?Skin: Skin is warm and dry. No rash noted. No diaphoresis. No erythema. No pallor. No clubbing, cyanosis, or edema.  ?Psychiatric: Normal mood and affect. Behavior is normal. Judgment and thought content normal.  ? ?Labs ?n/a ? ?Assessment and Plan:  ? ?Malignant neoplasm of central portion of right breast in female, estrogen receptor positive (CMS-HCC) ?Pt has new diagnosis of right breast cancer. This is an unclear size. Will plan MRI for better definition of the cancer. Barring unforeseen findings, will plan right breast seed localized lumpectomy and sentinel node biopsy. She will need genetics referral due to her age.  ? ?Surgery will likely be followed by oncotype if invasive cancer in >5 mm. Additionally,  adjuvant radiation and chemoprevention with antihormonal tx will be indicated. Consideration for ovarian blockade will also be present. She will need oncology and rad onc referrals.  ? ?The surgical procedure was described to the patient. I discussed the incision type and location and that we would need radiology involved on with a wire or seed marker and/or sentinel node.  ? ?The risks and benefits of the procedure were described to the patient and she wishes to proceed.  ? ?We discussed the risks bleeding, infection, damage to other structures, need for further procedures/surgeries. We discussed the risk of seroma. The patient was advised if the area in the breast in cancer, we may need to go back to surgery for additional tissue to obtain negative margins or for a lymph node biopsy. The patient was advised that these are the most common complications, but that others can occur as well. They were advised against taking aspirin or other anti-inflammatory agents/blood thinners the week before surgery.  ? ?Sacral pain ?Given this deep abdominal/flank/pelvic pain, will get staging scans.  ? ?If genetic testing is positive or we have surprising MR results, will readdress the plan.  ? ?No follow-ups on file. ? ?Milus Height, MD FACS ?Surgical Oncology, General Surgery, Trauma and Critical Care ?[facility] ?A [facility] ?

## 2022-03-05 NOTE — Anesthesia Procedure Notes (Signed)
Procedure Name: LMA Insertion ?Date/Time: 03/05/2022 12:35 PM ?Performed by: Minerva Ends, CRNA ?Pre-anesthesia Checklist: Patient identified, Emergency Drugs available, Suction available and Patient being monitored ?Patient Re-evaluated:Patient Re-evaluated prior to induction ?Oxygen Delivery Method: Circle system utilized ?Preoxygenation: Pre-oxygenation with 100% oxygen ?Induction Type: IV induction ?Ventilation: Mask ventilation without difficulty ?LMA: LMA inserted ?LMA Size: 4.0 ?Tube type: Oral ?Number of attempts: 1 ?Placement Confirmation: ETT inserted through vocal cords under direct vision, positive ETCO2 and breath sounds checked- equal and bilateral ?Tube secured with: Tape ?Dental Injury: Teeth and Oropharynx as per pre-operative assessment  ? ? ? ? ?

## 2022-03-05 NOTE — Anesthesia Postprocedure Evaluation (Signed)
Anesthesia Post Note ? ?Patient: Renee Schwartz ? ?Procedure(s) Performed: RIGHT BREAST LUMPECTOMY WITH RADIOACTIVE SEED AND SENTINEL LYMPH NODE BIOPSY (Right: Breast) ? ?  ? ?Patient location during evaluation: PACU ?Anesthesia Type: General ?Level of consciousness: awake and alert and oriented ?Pain management: pain level controlled ?Vital Signs Assessment: post-procedure vital signs reviewed and stable ?Respiratory status: spontaneous breathing, nonlabored ventilation and respiratory function stable ?Cardiovascular status: blood pressure returned to baseline and stable ?Postop Assessment: no apparent nausea or vomiting ?Anesthetic complications: no ? ? ?No notable events documented. ? ?Last Vitals:  ?Vitals:  ? 03/05/22 1435 03/05/22 1450  ?BP: 123/87 127/88  ?Pulse: 83 82  ?Resp: 12 (!) 22  ?Temp:    ?SpO2: 97% 98%  ?  ?Last Pain:  ?Vitals:  ? 03/05/22 1450  ?TempSrc:   ?PainSc: 3   ? ? ?  ?  ?  ?  ?  ?  ? ?Khady Vandenberg A. ? ? ? ? ?

## 2022-03-05 NOTE — Anesthesia Procedure Notes (Signed)
Anesthesia Regional Block: Pectoralis block  ? ?Pre-Anesthetic Checklist: , timeout performed,  Correct Patient, Correct Site, Correct Laterality,  Correct Procedure, Correct Position, site marked,  Risks and benefits discussed,  Surgical consent,  Pre-op evaluation,  At surgeon's request and post-op pain management ? ?Laterality: Right ? ?Prep: chloraprep     ?  ?Needles:  ?Injection technique: Single-shot ? ?Needle Type: Echogenic Stimulator Needle   ? ? ?Needle Length: 10cm  ?Needle Gauge: 21  ? ?Needle insertion depth: 8 cm ? ? ?Additional Needles: ? ? ?Procedures:,,,, ultrasound used (permanent image in chart),,    ?Narrative:  ?Start time: 03/05/2022 12:05 PM ?End time: 03/05/2022 12:10 PM ?Injection made incrementally with aspirations every 5 mL. ? ?Performed by: Personally  ?Anesthesiologist: Josephine Igo, MD ? ?Additional Notes: ?Timeout performed. Patient sedated. Relevant anatomy ID'd using [place]. Incremental 2-34m injection of LA with frequent aspiration. Patient tolerated procedure well. ? ? ? ?Right Pectoralis Block ? ?

## 2022-03-05 NOTE — Anesthesia Preprocedure Evaluation (Signed)
Anesthesia Evaluation  ?Patient identified by MRN, date of birth, ID band ?Patient awake ? ? ? ?Reviewed: ?Allergy & Precautions, NPO status , Patient's Chart, lab work & pertinent test results ? ?Airway ?Mallampati: II ? ?TM Distance: >3 FB ?Neck ROM: Full ? ? ? Dental ?no notable dental hx. ?(+) Teeth Intact, Dental Advisory Given ?  ?Pulmonary ?Current Smoker and Patient abstained from smoking.,  ?  ?Pulmonary exam normal ?breath sounds clear to auscultation ? ? ? ? ? ? Cardiovascular ?negative cardio ROS ?Normal cardiovascular exam ?Rhythm:Regular Rate:Normal ? ? ?  ?Neuro/Psych ?Anxiety negative neurological ROS ?   ? GI/Hepatic ?negative GI ROS, Neg liver ROS,   ?Endo/Other  ?Right Breast Ca ? Renal/GU ?negative Renal ROS  ?negative genitourinary ?  ?Musculoskeletal ?negative musculoskeletal ROS ?(+)  ? Abdominal ?  ?Peds ? Hematology ?negative hematology ROS ?(+)   ?Anesthesia Other Findings ? ? Reproductive/Obstetrics ? ?  ? ? ? ? ? ? ? ? ? ? ? ? ? ?  ?  ? ? ? ? ? ? ? ? ?Anesthesia Physical ?Anesthesia Plan ? ?ASA: 2 ? ?Anesthesia Plan: General  ? ?Post-op Pain Management: Regional block* and Tylenol PO (pre-op)*  ? ?Induction: Intravenous ? ?PONV Risk Score and Plan: 3 and Treatment may vary due to age or medical condition, Midazolam, Ondansetron and Dexamethasone ? ?Airway Management Planned: LMA ? ?Additional Equipment: None ? ?Intra-op Plan:  ? ?Post-operative Plan: Extubation in OR ? ?Informed Consent: I have reviewed the patients History and Physical, chart, labs and discussed the procedure including the risks, benefits and alternatives for the proposed anesthesia with the patient or authorized representative who has indicated his/her understanding and acceptance.  ? ? ? ?Dental advisory given ? ?Plan Discussed with: CRNA and Anesthesiologist ? ?Anesthesia Plan Comments:   ? ? ? ? ? ? ?Anesthesia Quick Evaluation ? ?

## 2022-03-05 NOTE — Discharge Instructions (Addendum)
[facility] Office Phone Number [phone]  BREAST BIOPSY/ PARTIAL MASTECTOMY: POST OP INSTRUCTIONS  Always review your discharge instruction sheet given to you by the facility where your surgery was performed.  IF YOU HAVE DISABILITY OR FAMILY LEAVE FORMS, YOU MUST BRING THEM TO THE OFFICE FOR PROCESSING.  DO NOT GIVE THEM TO YOUR DOCTOR.  Take 2 tylenol (acetominophen) three times a day for 3 days.  If you still have pain, add ibuprofen with food in between if able to take this (if you have kidney issues or stomach issues, do not take ibuprofen).  If both of those are not enough, add the narcotic pain pill.  If you find you are needing a lot of this overnight after surgery, call the next morning for a refill.    Prescriptions will not be filled after 5pm or on week-ends. Take your usually prescribed medications unless otherwise directed You should eat very light the first 24 hours after surgery, such as soup, crackers, pudding, etc.  Resume your normal diet the day after surgery. Most patients will experience some swelling and bruising in the breast.  Ice packs and a good support bra will help.  Swelling and bruising can take several days to resolve.  It is common to experience some constipation if taking pain medication after surgery.  Increasing fluid intake and taking a stool softener will usually help or prevent this problem from occurring.  A mild laxative (Milk of Magnesia or Miralax) should be taken according to package directions if there are no bowel movements after 48 hours. Unless discharge instructions indicate otherwise, you may remove your bandages 48 hours after surgery, and you may shower at that time.  You may have steri-strips (small skin tapes) in place directly over the incision.  These strips should be left on the skin at least for for 7-10 days.    ACTIVITIES:  You may resume regular daily activities (gradually increasing) beginning the next day.  Wearing a  good support bra or sports bra (or the breast binder) minimizes pain and swelling.  You may have sexual intercourse when it is comfortable. No heavy lifting for 1-2 weeks (not over around 10 pounds).  You may drive when you no longer are taking prescription pain medication, you can comfortably wear a seatbelt, and you can safely maneuver your car and apply brakes. RETURN TO WORK:  __________3-14 days depending on job. _______________ You should see your doctor in the office for a follow-up appointment approximately two weeks after your surgery.  Your doctor's nurse will typically make your follow-up appointment when she calls you with your pathology report.  Expect your pathology report 3-4 business days after your surgery.  You may call to check if you do not hear from us after three days.   WHEN TO CALL YOUR DOCTOR: Fever over 101.0 Nausea and/or vomiting. Extreme swelling or bruising. Continued bleeding from incision. Increased pain, redness, or drainage from the incision.  The clinic staff is available to answer your questions during regular business hours.  Please don't hesitate to call and ask to speak to one of the nurses for clinical concerns.  If you have a medical emergency, go to the nearest emergency room or call 911.  A surgeon from [facility] is always on call at the hospital.  For further questions, please visit [email or web address]   

## 2022-03-05 NOTE — Op Note (Signed)
Right Breast Radioactive seed localized lumpectomy and sentinel lymph node biopsy ? 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 ?Pre-operative Diagnosis: right breast cancer ? ?Post-operative Diagnosis: Same ? ?Surgeon: Stark Klein  ? ?Assistant: Carlena Hurl, PA-C ? ?Anesthesia: General endotracheal anesthesia ? ?ASA Class: 2 ? ?Procedure Details  ?The patient was seen in the Holding Room. The risks, benefits, complications, treatment options, and expected outcomes were discussed with the patient. The possibilities of bleeding, infection, the need for additional procedures, failure to diagnose a condition, and creating a complication requiring transfusion or operation were discussed with the patient. The patient concurred with the proposed plan, giving informed consent.  The site of surgery properly noted/marked. The patient was taken to Operating Room # 2, identified, and the procedure verified as right Breast Seed localized Lumpectomy with sentinel lymph node biopsy. The right arm, breast, and chest were prepped and draped in standard fashion.A Time Out was held and the above information confirmed. MagTrace was injected into the subareolar space. ? ? The lumpectomy was performed by creating a superior circumareolar incision near the previously placed radioactive seed.  Dissection was carried down to around the point of maximum signal intensity. The cautery was used to perform the dissection.  Hemostasis was achieved with cautery. The edges of the cavity were marked with large clips, with one each medial, lateral, inferior and superior, and two clips posteriorly.   The specimen was inked with the margin marker paint kit.    Specimen radiography confirmed inclusion of the mammographic lesion, the clip, and the seed.  The background signal in the breast was zero.  The wound was irrigated and closed with 3-0  vicryl in layers and 4-0 monocryl subcuticular suture.   ? ?Using a Sentimag probe, right axillary sentinel nodes were identified transcutaneously.  An oblique incision was created below the axillary hairline.  Dissection was carried through the clavipectoral fascia.  Five deep level 2 axillary sentinel nodes were removed.  Counts were 8900 at the highest.  #2 was not hot but was palpable.  #1, 3, 4, 5 were all hot.    The background count was 40 cps.  The wound was irrigated.  Hemostasis was achieved with cautery.  The axillary incision was closed with a 3-0 vicryl deep dermal interrupted sutures and a 4-0 monocryl subcuticular closure. ?   ?Sterile dressings were applied. At the end of the operation, all sponge, instrument, and needle counts were correct. ? ?Findings: ?grossly clear surgical margins and no adenopathy, anterior margin is skin, posterior margin is pectoralis.  Very small lymph nodes.  Dense breast tissue.   ? ?Estimated Blood Loss:  min ?        ?Specimens: right breast tissue with seed , additional superior margin, and additional lateral margin.  Five axillary sentinel lymph nodes.     ?        ?Complications:  None; patient tolerated the procedure well. ?        ?Disposition: PACU - hemodynamically stable. ?        ?Condition: stable ? ?

## 2022-03-05 NOTE — Transfer of Care (Signed)
Immediate Anesthesia Transfer of Care Note ? ?Patient: Renee Schwartz ? ?Procedure(s) Performed: RIGHT BREAST LUMPECTOMY WITH RADIOACTIVE SEED AND SENTINEL LYMPH NODE BIOPSY (Right: Breast) ? ?Patient Location: PACU ? ?Anesthesia Type:General ? ?Level of Consciousness: sedated ? ?Airway & Oxygen Therapy: Patient Spontanous Breathing ? ?Post-op Assessment: Report given to RN and Post -op Vital signs reviewed and stable ? ?Post vital signs: Reviewed and stable ? ?Last Vitals:  ?Vitals Value Taken Time  ?BP 132/84 03/05/22 1421  ?Temp    ?Pulse 71 03/05/22 1423  ?Resp 14 03/05/22 1423  ?SpO2 94 % 03/05/22 1423  ?Vitals shown include unvalidated device data. ? ?Last Pain:  ?Vitals:  ? 03/05/22 1138  ?TempSrc:   ?PainSc: 7   ?   ? ?  ? ?Complications: No notable events documented. ?

## 2022-03-06 ENCOUNTER — Encounter (HOSPITAL_COMMUNITY): Payer: Self-pay | Admitting: General Surgery

## 2022-03-10 ENCOUNTER — Ambulatory Visit: Payer: 59 | Admitting: Hematology and Oncology

## 2022-03-11 LAB — SURGICAL PATHOLOGY

## 2022-03-18 ENCOUNTER — Encounter: Payer: Self-pay | Admitting: *Deleted

## 2022-03-18 DIAGNOSIS — Z170 Estrogen receptor positive status [ER+]: Secondary | ICD-10-CM

## 2022-03-21 ENCOUNTER — Encounter: Payer: Self-pay | Admitting: Rehabilitation

## 2022-03-21 ENCOUNTER — Ambulatory Visit: Payer: 59 | Admitting: Rehabilitation

## 2022-03-21 DIAGNOSIS — Z170 Estrogen receptor positive status [ER+]: Secondary | ICD-10-CM

## 2022-03-21 DIAGNOSIS — C50111 Malignant neoplasm of central portion of right female breast: Secondary | ICD-10-CM | POA: Diagnosis not present

## 2022-03-21 DIAGNOSIS — Z9189 Other specified personal risk factors, not elsewhere classified: Secondary | ICD-10-CM

## 2022-03-21 DIAGNOSIS — Z483 Aftercare following surgery for neoplasm: Secondary | ICD-10-CM

## 2022-03-21 DIAGNOSIS — R293 Abnormal posture: Secondary | ICD-10-CM

## 2022-03-21 NOTE — Patient Instructions (Signed)
[facility] ? [address], [address] ? [place] [address] ? [phone] ? ?After Breast Cancer Class ?It is recommended you attend the ABC class to be educated on lymphedema risk reduction. This class is free of charge and lasts for 1 hour. It is a 1-time class. You will need to download the Webex app either on your phone or computer. We will send you a link the night before or the morning of the class. You should be able to click on that link to join the class. This is not a confidential class. You don't have to turn your camera on, but other participants may be able to see your email address. ? ?Scar massage ?You can begin gentle scar massage to you incision sites. Gently place one hand on the incision and move the skin (without sliding on the skin) in various directions. Do this for a few minutes and then you can gently massage either coconut oil or vitamin E cream into the scars. ? ?Compression garment ?You should continue wearing your compression bra until you feel like you no longer have swelling. ? ?Home exercise Program ?Continue doing the exercises you were given until you feel like you can do them without feeling any tightness at the end.  ? ?Walking Program ?Studies show that 30 minutes of walking per day (fast enough to elevate your heart rate) can significantly reduce the risk of a cancer recurrence. If you can't walk due to other medical reasons, we encourage you to find another activity you could do (like a stationary bike or water exercise). ? ?Posture ?After breast cancer surgery, people frequently sit with rounded shoulders posture because it puts their incisions on slack and feels better. If you sit like this and scar tissue forms in that position, you can become very tight and have pain sitting or standing with good posture. Try to be aware of your posture and sit and stand up tall to heal properly. ? ?Follow up PT: ?It is recommended you return every 3 months for the  first 3 years following surgery to be assessed on the SOZO machine for an L-Dex score. This helps prevent clinically significant lymphedema in 95% of patients. These follow up screens are 10 minute appointments that you are not billed for. - 06/16/22 at 830am at the PT office ?

## 2022-03-21 NOTE — Therapy (Addendum)
 OUTPATIENT PHYSICAL THERAPY BREAST CANCER POST OP FOLLOW UP   Patient Name: Renee Schwartz MRN: [id number] DOB:30-Dec-1970, 51 y.o., female Today's Date: 03/21/2022   PT End of Session - 03/21/22 0924     Visit Number 2    Number of Visits 2    PT Start Time 0900    PT Stop Time 0923    PT Time Calculation (min) 23 min    Activity Tolerance Patient tolerated treatment well    Behavior During Therapy [facility] for tasks assessed/performed             Past Medical History:  Diagnosis Date   Anxiety    Past Surgical History:  Procedure Laterality Date   BREAST BIOPSY Right 02/14/2022   MRI Bx   BREAST BIOPSY Right 01/22/2022   invasive ductal   BREAST LUMPECTOMY WITH RADIOACTIVE SEED AND SENTINEL LYMPH NODE BIOPSY Right 03/05/2022   Procedure: RIGHT BREAST LUMPECTOMY WITH RADIOACTIVE SEED AND SENTINEL LYMPH NODE BIOPSY;  Surgeon: Almond Lint, MD;  Location: [facility] OR;  Service: General;  Laterality: Right;   TUBAL LIGATION  1999   WISDOM TOOTH EXTRACTION Bilateral    Patient Active Problem List   Diagnosis Date Noted   Genetic testing 02/13/2022   Family history of breast cancer 02/06/2022   Malignant neoplasm of central portion of right breast in female, estrogen receptor positive (HCC) 01/29/2022    PCP: Tennis Ship, NP  REFERRING PROVIDER: Almond Lint, MD  REFERRING DIAG: Malignant neoplasm of central portion of right breast in female, estrogen receptor positive (HCC)  THERAPY DIAG:  Malignant neoplasm of central portion of right breast in female, estrogen receptor positive (HCC)  Abnormal posture  At risk for lymphedema  Aftercare following surgery for neoplasm  ONSET DATE: 02/19/22  SUBJECTIVE:                                                                                                                                                                                           SUBJECTIVE STATEMENT: I am doing well.  I will be doing radiation.  I used the  compression bra x 1 week  PERTINENT HISTORY:  Patient was diagnosed with Rt breast cancer post lumpectomy and SLNB 0/5 LN on 03/05/22 with Dr. Donell Beers with upcoming radiation  PATIENT GOALS:  Reassess how my recovery is going related to arm function, pain, and swelling.  PAIN:  Are you having pain? No only uncomfortable Rt axilla   PRECAUTIONS: Recent Surgery, right UE Lymphedema risk  ACTIVITY LEVEL / LEISURE: doing post op stretches and walking   OBJECTIVE:   PATIENT SURVEYS:  QUICK DASH: 13%  OBSERVATIONS:  No cording noted - well healed incisions  POSTURE:  Rounded shoulders   LYMPHEDEMA ASSESSMENT:   UPPER EXTREMITY AROM/PROM:   A/PROM RIGHT  02/25/2022   03/21/22  Shoulder extension 60 65  Shoulder flexion 163 170  Shoulder abduction 170 170  Shoulder internal rotation 85   Shoulder external rotation 90 90                          (Blank rows = not tested)     CERVICAL AROM: All within normal limits:    UPPER EXTREMITY STRENGTH: 5/5 bil     LYMPHEDEMA ASSESSMENTS:    LANDMARK RIGHT  02/25/2022 03/21/22  10 cm proximal to olecranon process 29 29  Olecranon process 26 26  10  cm proximal to ulnar styloid process 21.2 21.2  Just proximal to ulnar styloid process 16.2 16.3  Across hand at thumb web space 19.5 19.5  At base of 2nd digit 6.2 6.2  (Blank rows = not tested)  PATIENT EDUCATION:  Education details: post op instruction Person educated: Patient Education method: Explanation and Handouts Education comprehension: verbalized understanding   HOME EXERCISE PROGRAM:  Reviewed previously given post op HEP.   ASSESSMENT:  CLINICAL IMPRESSION: Pt presents post lumpectomy and SLNB with return to prior AROM, no signs of lymphedema, cording, or further PT needs at this time.  Pt will return for SOZO scans every 3 months.   Pt will benefit from skilled therapeutic intervention to improve on the following deficits: Decreased knowledge of precautions,  impaired UE functional use, pain, decreased ROM, postural dysfunction.   PT treatment/interventions: ADL/Self care home management, Therapeutic exercises and Patient/Family education     GOALS: Goals reviewed with patient? Yes  LONG TERM GOALS:  (STG=LTG)  GOALS Name Target Date Goal status  1 Pt will demonstrate she has regained full shoulder ROM and function post operatively compared to baselines.  Baseline: 03/21/22 MET                    PLAN: PT FREQUENCY/DURATION: SOZO scans  PLAN FOR NEXT SESSION: SOZO 3 month scans until at least 03/05/24   [facility]  [address], [address]  [place] [address]  [phone]  After Breast Cancer Class It is recommended you attend the ABC class to be educated on lymphedema risk reduction. This class is free of charge and lasts for 1 hour. It is a 1-time class. You will need to download the Webex app either on your phone or computer. We will send you a link the night before or the morning of the class. You should be able to click on that link to join the class. This is not a confidential class. You don't have to turn your camera on, but other participants may be able to see your email address. 04/07/22  Scar massage You can begin gentle scar massage to you incision sites. Gently place one hand on the incision and move the skin (without sliding on the skin) in various directions. Do this for a few minutes and then you can gently massage either coconut oil or vitamin E cream into the scars.  Compression garment You should continue wearing your compression bra until you feel like you no longer have swelling.  Home exercise Program Continue doing the exercises you were given until you feel like you can do them without feeling any tightness at the end.   Walking Program Studies show  that 30 minutes of walking per day (fast enough to elevate your heart rate) can significantly reduce the risk of a cancer recurrence.  If you can't walk due to other medical reasons, we encourage you to find another activity you could do (like a stationary bike or water exercise).  Posture After breast cancer surgery, people frequently sit with rounded shoulders posture because it puts their incisions on slack and feels better. If you sit like this and scar tissue forms in that position, you can become very tight and have pain sitting or standing with good posture. Try to be aware of your posture and sit and stand up tall to heal properly.  Follow up PT: It is recommended you return every 3 months for the first 3 years following surgery to be assessed on the SOZO machine for an L-Dex score. This helps prevent clinically significant lymphedema in 95% of patients. These follow up screens are 10 minute appointments that you are not billed for. - 06/16/22 at 830am at the PT office  Idamae Lusher, PT 03/21/2022, 9:25 AM  PHYSICAL THERAPY DISCHARGE SUMMARY  Visits from Start of Care: 2  Current functional level related to goals / functional outcomes: Per above   Remaining deficits: Lymphedema risk    Education / Equipment: Per above  Plan: Patient agrees to discharge.   Patient is being discharged due to meeting the stated rehab goals.

## 2022-03-25 ENCOUNTER — Encounter: Payer: Self-pay | Admitting: Hematology and Oncology

## 2022-03-25 ENCOUNTER — Other Ambulatory Visit: Payer: Self-pay

## 2022-03-25 ENCOUNTER — Inpatient Hospital Stay: Payer: 59 | Attending: Hematology and Oncology | Admitting: Hematology and Oncology

## 2022-03-25 DIAGNOSIS — C50111 Malignant neoplasm of central portion of right female breast: Secondary | ICD-10-CM | POA: Diagnosis present

## 2022-03-25 DIAGNOSIS — F17210 Nicotine dependence, cigarettes, uncomplicated: Secondary | ICD-10-CM | POA: Diagnosis not present

## 2022-03-25 DIAGNOSIS — Z170 Estrogen receptor positive status [ER+]: Secondary | ICD-10-CM | POA: Diagnosis present

## 2022-03-25 NOTE — Progress Notes (Signed)
[facility] ?CONSULT NOTE ? ?Patient Care Team: ?Bing Neighbors, NP as PCP - General (Family Medicine) ?Mauro Kaufmann, RN as Oncology Nurse Navigator ?Rockwell Germany, RN as Oncology Nurse Navigator ? ?CHIEF COMPLAINTS/PURPOSE OF CONSULTATION:  ?Newly diagnosed breast cancer ? ?HISTORY OF PRESENTING ILLNESS:  ?Renee Schwartz 51 y.o. female is here because of recent diagnosis of right breast cancer. ?. ? ?SUMMARY OF ONCOLOGIC HISTORY: ?Oncology History  ?Malignant neoplasm of central portion of right breast in female, estrogen receptor positive ([facility])  ?01/22/2022 Pathology Results  ? Right breast needle core biopsy showed invasive ductal carcinoma grade 1 out of 3 measuring 1.3 cm in greatest linear extent.  Prognostic showed ER 95% positive, moderate staining intensity PR 100% positive strong staining intensity KI 1% and HER2 negative ?  ?01/29/2022 Initial Diagnosis  ? Malignant neoplasm of central portion of right breast in female, estrogen receptor positive ([facility]) ?  ?02/24/2022 Genetic Testing  ? Negative genetic testing on the STAT panel.  The STAT Breast cancer panel offered by [facility] includes sequencing and rearrangement analysis for the following 9 genes:  ATM, BRCA1, BRCA2, CDH1, CHEK2, PALB2, PTEN, STK11 and TP53.   The report date is February 13, 2022. Negative genetic testing on the CancerNext-Expanded+RNAinsight.  CDKN2A VUS identified.  The report date is February 24, 2022. ? ?The CancerNext-Expanded gene panel offered by [facility] and includes sequencing and rearrangement analysis for the following 77 genes: AIP, ALK, APC*, ATM*, AXIN2, BAP1, BARD1, BLM, BMPR1A, BRCA1*, BRCA2*, BRIP1*, CDC73, CDH1*, CDK4, CDKN1B, CDKN2A, CHEK2*, CTNNA1, DICER1, FANCC, FH, FLCN, GALNT12, KIF1B, LZTR1, MAX, MEN1, MET, MLH1*, MSH2*, MSH3, MSH6*, MUTYH*, NBN, NF1*, NF2, NTHL1, PALB2*, PHOX2B, PMS2*, POT1, PRKAR1A, PTCH1, PTEN*, RAD51C*, RAD51D*, RB1, RECQL, RET, SDHA, SDHAF2, SDHB, SDHC, SDHD, SMAD4, SMARCA4,  SMARCB1, SMARCE1, STK11, SUFU, TMEM127, TP53*, TSC1, TSC2, VHL and XRCC2 (sequencing and deletion/duplication); EGFR, EGLN1, HOXB13, KIT, MITF, PDGFRA, POLD1, and POLE (sequencing only); EPCAM and GREM1 (deletion/duplication only). DNA and RNA analyses performed for * genes.  ?  ?03/05/2022 Surgery  ? She had right breast lumpectomy which showed atypical lobular hyperplasia, columnar cell and fibrocystic changes with apocrine metaplasia and calcifications.  [place] noticed previous biopsy site changes no residual carcinoma identified.  Right breast additional superior margin excision again with fibrocystic changes, no carcinoma.  Right additional lateral margin excision again with atypical lobular hyperplasia, columnar cell and fibrocystic changes, no carcinoma identified.  5 out of 5 sentinel lymph nodes negative. ?  ? ?MEDICAL HISTORY:  ?Reviewed and none. ? ?SURGICAL HISTORY: ?She had tubal ligation many years ago after birth of her second child. ? ?Interval history ?She is here for follow-up after right breast lumpectomy.  She denies any issues.  She still notices the Steri-Strips in place.  Right axillary incision is not very bothersome anymore.  She has gotten her range of motion back.  No other complaints.  She was hoping to do radiation and done well since she has to do it daily for 4 to 6 weeks.  Rest of the pertinent 10 point ROS reviewed and negative. ? ?SOCIAL HISTORY: ?Social History  ? ?Socioeconomic History  ? Marital status: Married  ?  Spouse name: Not on file  ? Number of children: Not on file  ? Years of education: Not on file  ? Highest education level: Not on file  ?Occupational History  ? Not on file  ?Tobacco Use  ? Smoking status: Every Day  ?  Packs/day: 1.00  ?  Types: Cigarettes  ? Smokeless tobacco: Never  ?Vaping Use  ? Vaping Use: Never used  ?Substance and Sexual Activity  ? Alcohol use: Never  ? Drug use: Never  ? Sexual activity: Not on file  ?Other Topics Concern  ? Not on file  ?Social  History Narrative  ? Not on file  ? ?Social Determinants of Health  ? ?Financial Resource Strain: Not on file  ?Food Insecurity: Not on file  ?Transportation Needs: Not on file  ?Physical Activity: Not on file  ?Stress: Not on file  ?Social Connections: Not on file  ?Intimate Partner Violence: Not At Risk  ? Fear of Current or Ex-Partner: No  ? Emotionally Abused: No  ? Physically Abused: No  ? Sexually Abused: No  ? ? ?FAMILY HISTORY: ?Family History  ?Problem Relation Age of Onset  ? Cancer Mother 18  ?     abdominal cancer; refused treatment - Panc vs colon  ? Huntington's disease Father 57  ?     d. 47  ? Non-Hodgkin's lymphoma Brother 33  ? Cancer Maternal Grandmother   ?     GYN cancer  ? Breast cancer Maternal Grandmother 42  ? Huntington's disease Paternal Grandmother 24  ?     d. 33  ? Wilm's tumor Son 3  ?     second dx at 44  ? Breast cancer Other 30  ?     dbl mastectomy - MGMs sister  ? ? ?ALLERGIES:  is allergic to lisinopril and penicillins. ? ?MEDICATIONS:  ?Current Outpatient Medications  ?Medication Sig Dispense Refill  ? aspirin-acetaminophen-caffeine (EXCEDRIN MIGRAINE) 250-250-65 MG tablet Take 1 tablet by mouth every 6 (six) hours as needed for migraine.    ? ergocalciferol (VITAMIN D2) 1.25 MG (50000 UT) capsule Take 50,000 Units by mouth once a week.    ? magnesium oxide (MAG-OX) 400 MG tablet Take 400 mg by mouth daily.    ? oxyCODONE (OXY IR/ROXICODONE) 5 MG immediate release tablet Take 1 tablet (5 mg total) by mouth every 6 (six) hours as needed for severe pain. 8 tablet 0  ? Turmeric 500 MG CAPS Take 500 mg by mouth daily.    ? ?No current facility-administered medications for this visit.  ? ? ?REVIEW OF SYSTEMS:   ?Constitutional: Denies fevers, chills or abnormal night sweats ?Eyes: Denies blurriness of vision, double vision or watery eyes ?Ears, nose, mouth, throat, and face: Denies mucositis or sore throat ?Respiratory: Denies cough, dyspnea or wheezes ?Cardiovascular: Denies  palpitation, chest discomfort or lower extremity swelling ?Gastrointestinal:  Denies nausea, heartburn or change in bowel habits ?Skin: Denies abnormal skin rashes ?Lymphatics: Denies new lymphadenopathy or easy bruising ?Neurological:Denies numbness, tingling or new weaknesses ?Behavioral/Psych: Mood is stable, no new changes  ?Breast:  Denies any palpable lumps or discharge ?All other systems were reviewed with the patient and are negative. ? ?PHYSICAL EXAMINATION: ?ECOG PERFORMANCE STATUS: 0 - Asymptomatic ? ?Vitals:  ? 03/25/22 1527  ?BP: (!) 138/97  ?Pulse: (!) 107  ?Resp: 18  ?Temp: 97.9 ?F (36.6 ?C)  ?SpO2: 98%  ? ?Filed Weights  ? 03/25/22 1527  ?Weight: 177 lb 4.8 oz (80.4 kg)  ? ? ?GENERAL:alert, no distress and comfortable ?Right breast status postlumpectomy, Steri-Strips in place.  Postop bruising noted.  Right axillary surgical incision well-healed.   ? ?LABORATORY DATA:  ?I have reviewed the data as listed ?Lab Results  ?Component Value Date  ? WBC 12.1 (H) 02/06/2022  ? HGB 16.0 (H) 02/06/2022  ?  HCT 48.0 (H) 02/06/2022  ? MCV 87.8 02/06/2022  ? PLT 343 02/06/2022  ? ?Lab Results  ?Component Value Date  ? NA 138 02/06/2022  ? K 4.2 02/06/2022  ? CL 105 02/06/2022  ? CO2 27 02/06/2022  ? ? ?RADIOGRAPHIC STUDIES: ?I have personally reviewed the radiological reports and agreed with the findings in the report. ? ?ASSESSMENT AND PLAN:  ?Malignant neoplasm of central portion of right breast in female, estrogen receptor positive ([place]) ?This is a very pleasant 51 year old premenopausal female patient with newly diagnosed right breast invasive ductal carcinoma, size unclear at this time, awaiting MRI assessment, ER/PR positive, Ki-67 of 1%, HER2 negative on biopsy referred to breast center for further recommendations. ? ?She had right breast lumpectomy which did not show any evidence of residual malignancy.  5 out of 5 sentinel lymph nodes negative.  Given small size of tumor on the initial biopsy measuring 3  mm, grade 1, low proliferation index and strongly ER/PR positivity, I do not believe we need to proceed with Oncotype testing.  She will proceed with adjuvant radiation. ?She will then proceed with antiestr

## 2022-03-25 NOTE — Assessment & Plan Note (Addendum)
This is a very pleasant 51 year old premenopausal female patient with newly diagnosed right breast invasive ductal carcinoma, size unclear at this time, awaiting MRI assessment, ER/PR positive, Ki-67 of 1%, HER2 negative on biopsy referred to breast center for further recommendations. ? ?She had right breast lumpectomy which did not show any evidence of residual malignancy.  5 out of 5 sentinel lymph nodes negative.  Given small size of tumor on the initial biopsy measuring 3 mm, grade 1, low proliferation index and strongly ER/PR positivity, I do not believe we need to proceed with Oncotype testing.  She will proceed with adjuvant radiation. ?She will then proceed with antiestrogen therapy.  We have discussed about tamoxifen since she is perimenopausal.  Her last menstrual cycle was in January 2023.  We have discussed about mechanism of action of tamoxifen, adverse effects of tamoxifen including but not fatigue, postmenopausal symptoms please risk of DVT/PE, increased risk of endometrial cancer. ? ?Benefit from tamoxifen would be improvement in bone density besides reduced risk of breast cancer.  She understands that she is at high risk of blood clots given active smoking and she is working on smoking cessation.  I do not see a clear need for ovarian suppression with aromatase inhibitors unless she fails to tolerate tamoxifen well. ?She will return to clinic in about 6 weeks to start tamoxifen. ?

## 2022-03-26 ENCOUNTER — Encounter (HOSPITAL_COMMUNITY): Payer: Self-pay

## 2022-03-27 ENCOUNTER — Encounter: Payer: Self-pay | Admitting: *Deleted

## 2022-05-05 ENCOUNTER — Encounter: Payer: Self-pay | Admitting: *Deleted

## 2022-05-09 ENCOUNTER — Inpatient Hospital Stay: Payer: 59 | Admitting: Hematology and Oncology

## 2022-05-28 ENCOUNTER — Inpatient Hospital Stay: Payer: 59 | Attending: Hematology and Oncology | Admitting: Hematology and Oncology

## 2022-05-28 ENCOUNTER — Encounter: Payer: Self-pay | Admitting: *Deleted

## 2022-05-28 ENCOUNTER — Other Ambulatory Visit: Payer: Self-pay

## 2022-05-28 ENCOUNTER — Encounter: Payer: Self-pay | Admitting: Hematology and Oncology

## 2022-05-28 DIAGNOSIS — Z803 Family history of malignant neoplasm of breast: Secondary | ICD-10-CM | POA: Diagnosis not present

## 2022-05-28 DIAGNOSIS — Z9851 Tubal ligation status: Secondary | ICD-10-CM | POA: Insufficient documentation

## 2022-05-28 DIAGNOSIS — Z170 Estrogen receptor positive status [ER+]: Secondary | ICD-10-CM | POA: Insufficient documentation

## 2022-05-28 DIAGNOSIS — C50111 Malignant neoplasm of central portion of right female breast: Secondary | ICD-10-CM

## 2022-05-28 DIAGNOSIS — R110 Nausea: Secondary | ICD-10-CM | POA: Insufficient documentation

## 2022-05-28 DIAGNOSIS — Z807 Family history of other malignant neoplasms of lymphoid, hematopoietic and related tissues: Secondary | ICD-10-CM | POA: Diagnosis not present

## 2022-05-28 DIAGNOSIS — R5383 Other fatigue: Secondary | ICD-10-CM | POA: Diagnosis not present

## 2022-05-28 DIAGNOSIS — F17210 Nicotine dependence, cigarettes, uncomplicated: Secondary | ICD-10-CM | POA: Insufficient documentation

## 2022-05-28 DIAGNOSIS — Z800 Family history of malignant neoplasm of digestive organs: Secondary | ICD-10-CM | POA: Insufficient documentation

## 2022-05-28 MED ORDER — TAMOXIFEN CITRATE 20 MG PO TABS: 20.0000 mg | ORAL_TABLET | Freq: Every day | ORAL | 3 refills | Status: DC

## 2022-05-28 NOTE — Assessment & Plan Note (Addendum)
This is a very pleasant 51 year old premenopausal female patient with newly diagnosed right breast invasive ductal carcinoma, size unclear at this time, awaiting MRI assessment, ER/PR positive, Ki-67 of 1%, HER2 negative , had right breast lumpectomy which did not show any evidence of residual malignancy.  5 out of 5 sentinel lymph nodes negative.  Given small size of tumor on the initial biopsy measuring 3 mm, grade 1, low proliferation index and strongly ER/PR positivity, I do not believe we need to proceed with Oncotype testing.    She is still undergoing radiation at this time.  Anticipated completion date 38 end of June.  She is tolerating it well except for some mild fatigue and nausea. We once again talked about tamoxifen today as her choice of adjuvant antiestrogen therapy given her premenopausal status.  I discussed about the mechanism of action of tamoxifen, adverse effects of tamoxifen including but not limited to postmenopausal symptoms, increased risk of DVT/PE, endometrial cancer and hyperplasia in some patients..  She was encouraged to reach out to [place] with any symptoms concerning for DVT/PE or irregular menstrual cycles or go to the nearest hospital if she is very symptomatic.  She expressed understanding.  She was strongly encouraged to consider smoking cessation since concomitant smoking can increase the risk of DVT/PE.  She will talk to the nurse practitioner once again about considering pharmacological measures to quit smoking.  She was instructed to return to clinic in 4 months.  She will wait till she completes radiation and start tamoxifen after completion of radiation.

## 2022-05-28 NOTE — Progress Notes (Signed)
[facility] NOTE  Patient Care Team: Bradner, Vilinda Blanks, NP as PCP - General (Family Medicine) Mauro Kaufmann, RN as Oncology Nurse Navigator Rockwell Germany, RN as Oncology Nurse Navigator  CHIEF COMPLAINTS/PURPOSE OF CONSULTATION:  Breast cancer follow up  HISTORY OF PRESENTING ILLNESS:  Renee Schwartz 51 y.o. female is here because of recent diagnosis of right breast cancer. .  SUMMARY OF ONCOLOGIC HISTORY: Oncology History  Malignant neoplasm of central portion of right breast in female, estrogen receptor positive ([place])  01/22/2022 Pathology Results   Right breast needle core biopsy showed invasive ductal carcinoma grade 1 out of 3 measuring 1.3 cm in greatest linear extent.  Prognostic showed ER 95% positive, moderate staining intensity PR 100% positive strong staining intensity KI 1% and HER2 negative   01/29/2022 Initial Diagnosis   Malignant neoplasm of central portion of right breast in female, estrogen receptor positive ([place])   02/24/2022 Genetic Testing   Negative genetic testing on the STAT panel.  The STAT Breast cancer panel offered by [facility] includes sequencing and rearrangement analysis for the following 9 genes:  ATM, BRCA1, BRCA2, CDH1, CHEK2, PALB2, PTEN, STK11 and TP53.   The report date is February 13, 2022. Negative genetic testing on the CancerNext-Expanded+RNAinsight.  CDKN2A VUS identified.  The report date is February 24, 2022.  The CancerNext-Expanded gene panel offered by [facility] and includes sequencing and rearrangement analysis for the following 77 genes: AIP, ALK, APC*, ATM*, AXIN2, BAP1, BARD1, BLM, BMPR1A, BRCA1*, BRCA2*, BRIP1*, CDC73, CDH1*, CDK4, CDKN1B, CDKN2A, CHEK2*, CTNNA1, DICER1, FANCC, FH, FLCN, GALNT12, KIF1B, LZTR1, MAX, MEN1, MET, MLH1*, MSH2*, MSH3, MSH6*, MUTYH*, NBN, NF1*, NF2, NTHL1, PALB2*, PHOX2B, PMS2*, POT1, PRKAR1A, PTCH1, PTEN*, RAD51C*, RAD51D*, RB1, RECQL, RET, SDHA, SDHAF2, SDHB, SDHC, SDHD, SMAD4, SMARCA4,  SMARCB1, SMARCE1, STK11, SUFU, TMEM127, TP53*, TSC1, TSC2, VHL and XRCC2 (sequencing and deletion/duplication); EGFR, EGLN1, HOXB13, KIT, MITF, PDGFRA, POLD1, and POLE (sequencing only); EPCAM and GREM1 (deletion/duplication only). DNA and RNA analyses performed for * genes.    03/05/2022 Surgery   She had right breast lumpectomy which showed atypical lobular hyperplasia, columnar cell and fibrocystic changes with apocrine metaplasia and calcifications.  [place] noticed previous biopsy site changes no residual carcinoma identified.  Right breast additional superior margin excision again with fibrocystic changes, no carcinoma.  Right additional lateral margin excision again with atypical lobular hyperplasia, columnar cell and fibrocystic changes, no carcinoma identified.  5 out of 5 sentinel lymph nodes negative.    MEDICAL HISTORY:  Reviewed and none.  SURGICAL HISTORY: She had tubal ligation many years ago after birth of her second child.  Interval history  She says she just started radiation and done well about a week ago and her last date is anticipated to be the end of June.  So far she has been tolerating it well except for some nausea and fatigue.  She still has the bruise from the surgery and right breast.  She otherwise continues to smoke but working with the nurse practitioner for smoking cessation.  Rest of the pertinent 10 point ROS reviewed and negative  SOCIAL HISTORY: Social History   Socioeconomic History   Marital status: Married    Spouse name: Not on file   Number of children: Not on file   Years of education: Not on file   Highest education level: Not on file  Occupational History   Not on file  Tobacco Use   Smoking status: Every Day    Packs/day: 1.00  Types: Cigarettes   Smokeless tobacco: Never  Vaping Use   Vaping Use: Never used  Substance and Sexual Activity   Alcohol use: Never   Drug use: Never   Sexual activity: Not on file  Other Topics Concern    Not on file  Social History Narrative   Not on file   Social Determinants of Health   Financial Resource Strain: Not on file  Food Insecurity: Not on file  Transportation Needs: Not on file  Physical Activity: Not on file  Stress: Not on file  Social Connections: Not on file  Intimate Partner Violence: Not At Risk   Fear of Current or Ex-Partner: No   Emotionally Abused: No   Physically Abused: No   Sexually Abused: No    FAMILY HISTORY: Family History  Problem Relation Age of Onset   Cancer Mother 73       abdominal cancer; refused treatment - Panc vs colon   Huntington's disease Father 7       d. 74   Non-Hodgkin's lymphoma Brother 50   Cancer Maternal Grandmother        GYN cancer   Breast cancer Maternal Grandmother 21   Huntington's disease Paternal Grandmother 74       d. 75   Wilm's tumor Son 3       second dx at 5   Breast cancer Other 38       dbl mastectomy - MGMs sister    ALLERGIES:  is allergic to lisinopril and penicillins.  MEDICATIONS:  Current Outpatient Medications  Medication Sig Dispense Refill   aspirin-acetaminophen-caffeine (EXCEDRIN MIGRAINE) 250-250-65 MG tablet Take 1 tablet by mouth every 6 (six) hours as needed for migraine.     ergocalciferol (VITAMIN D2) 1.25 MG (50000 UT) capsule Take 50,000 Units by mouth once a week.     magnesium oxide (MAG-OX) 400 MG tablet Take 400 mg by mouth daily.     oxyCODONE (OXY IR/ROXICODONE) 5 MG immediate release tablet Take 1 tablet (5 mg total) by mouth every 6 (six) hours as needed for severe pain. 8 tablet 0   Turmeric 500 MG CAPS Take 500 mg by mouth daily.     No current facility-administered medications for this visit.    REVIEW OF SYSTEMS:   Constitutional: Denies fevers, chills or abnormal night sweats Eyes: Denies blurriness of vision, double vision or watery eyes Ears, nose, mouth, throat, and face: Denies mucositis or sore throat Respiratory: Denies cough, dyspnea or  wheezes Cardiovascular: Denies palpitation, chest discomfort or lower extremity swelling Gastrointestinal:  Denies nausea, heartburn or change in bowel habits Skin: Denies abnormal skin rashes Lymphatics: Denies new lymphadenopathy or easy bruising Neurological:Denies numbness, tingling or new weaknesses Behavioral/Psych: Mood is stable, no new changes  Breast:  Denies any palpable lumps or discharge All other systems were reviewed with the patient and are negative.  PHYSICAL EXAMINATION: ECOG PERFORMANCE STATUS: 0 - Asymptomatic  Vitals:   05/28/22 1139  BP: 119/76  Pulse: 98  Resp: 18  Temp: (!) 97.3 F (36.3 C)  SpO2: 99%    Filed Weights   05/28/22 1139  Weight: 174 lb 8 oz (79.2 kg)     Physical Exam Constitutional:      Appearance: Normal appearance.  Chest:     Comments: Right breast bruise noted but significantly better compared to last visit.  Some redness from ongoing radiation Musculoskeletal:     Cervical back: Normal range of motion and neck supple. No rigidity.  Lymphadenopathy:     Cervical: No cervical adenopathy.  Neurological:     Mental Status: She is alert.     LABORATORY DATA:  I have reviewed the data as listed Lab Results  Component Value Date   WBC 12.1 (H) 02/06/2022   HGB 16.0 (H) 02/06/2022   HCT 48.0 (H) 02/06/2022   MCV 87.8 02/06/2022   PLT 343 02/06/2022   Lab Results  Component Value Date   NA 138 02/06/2022   K 4.2 02/06/2022   CL 105 02/06/2022   CO2 27 02/06/2022    RADIOGRAPHIC STUDIES: I have personally reviewed the radiological reports and agreed with the findings in the report.  ASSESSMENT AND PLAN:  Malignant neoplasm of central portion of right breast in female, estrogen receptor positive ([place]) This is a very pleasant 51 year old premenopausal female patient with newly diagnosed right breast invasive ductal carcinoma, size unclear at this time, awaiting MRI assessment, ER/PR positive, Ki-67 of 1%, HER2 negative  , had right breast lumpectomy which did not show any evidence of residual malignancy.  5 out of 5 sentinel lymph nodes negative.  Given small size of tumor on the initial biopsy measuring 3 mm, grade 1, low proliferation index and strongly ER/PR positivity, I do not believe we need to proceed with Oncotype testing.    She is still undergoing radiation at this time.  Anticipated completion date 30 end of June.  She is tolerating it well except for some mild fatigue and nausea. We once again talked about tamoxifen today as her choice of adjuvant antiestrogen therapy given her premenopausal status.  I discussed about the mechanism of action of tamoxifen, adverse effects of tamoxifen including but not limited to postmenopausal symptoms, increased risk of DVT/PE, endometrial cancer and hyperplasia in some patients..  She was encouraged to reach out to [place] with any symptoms concerning for DVT/PE or irregular menstrual cycles or go to the nearest hospital if she is very symptomatic.  She expressed understanding.  She was strongly encouraged to consider smoking cessation since concomitant smoking can increase the risk of DVT/PE.  She will talk to the nurse practitioner once again about considering pharmacological measures to quit smoking.  She was instructed to return to clinic in 4 months.  She will wait till she completes radiation and start tamoxifen after completion of radiation.  Total time spent: 30 minutes.    All questions were answered. The patient knows to call the clinic with any problems, questions or concerns.    Benay Pike, MD 05/28/22

## 2022-06-10 ENCOUNTER — Telehealth: Payer: Self-pay | Admitting: *Deleted

## 2022-06-16 ENCOUNTER — Ambulatory Visit: Payer: 59 | Attending: General Surgery

## 2022-06-26 ENCOUNTER — Encounter (INDEPENDENT_AMBULATORY_CARE_PROVIDER_SITE_OTHER): Payer: Self-pay | Admitting: *Deleted

## 2022-09-02 ENCOUNTER — Telehealth: Payer: Self-pay | Admitting: Adult Health

## 2022-09-02 NOTE — Telephone Encounter (Signed)
Rescheduled appointment per provider PAL. Patient is aware of the changes made to her upcoming appointment. 

## 2022-09-30 ENCOUNTER — Encounter: Payer: 59 | Admitting: Adult Health

## 2022-09-30 ENCOUNTER — Other Ambulatory Visit: Payer: Self-pay

## 2022-09-30 ENCOUNTER — Encounter: Payer: Self-pay | Admitting: Adult Health

## 2022-09-30 ENCOUNTER — Inpatient Hospital Stay: Payer: 59 | Attending: Hematology and Oncology | Admitting: Adult Health

## 2022-09-30 VITALS — BP 152/97 | HR 115 | Temp 97.4°F | Resp 18 | Ht 69.0 in | Wt 173.2 lb

## 2022-09-30 DIAGNOSIS — Z9851 Tubal ligation status: Secondary | ICD-10-CM | POA: Insufficient documentation

## 2022-09-30 DIAGNOSIS — Z807 Family history of other malignant neoplasms of lymphoid, hematopoietic and related tissues: Secondary | ICD-10-CM | POA: Diagnosis not present

## 2022-09-30 DIAGNOSIS — C50111 Malignant neoplasm of central portion of right female breast: Secondary | ICD-10-CM

## 2022-09-30 DIAGNOSIS — R232 Flushing: Secondary | ICD-10-CM | POA: Diagnosis not present

## 2022-09-30 DIAGNOSIS — Z803 Family history of malignant neoplasm of breast: Secondary | ICD-10-CM | POA: Diagnosis not present

## 2022-09-30 DIAGNOSIS — F17210 Nicotine dependence, cigarettes, uncomplicated: Secondary | ICD-10-CM | POA: Insufficient documentation

## 2022-09-30 DIAGNOSIS — Z170 Estrogen receptor positive status [ER+]: Secondary | ICD-10-CM | POA: Diagnosis not present

## 2022-09-30 DIAGNOSIS — R252 Cramp and spasm: Secondary | ICD-10-CM | POA: Insufficient documentation

## 2022-09-30 DIAGNOSIS — Z800 Family history of malignant neoplasm of digestive organs: Secondary | ICD-10-CM | POA: Diagnosis not present

## 2022-09-30 DIAGNOSIS — Z79810 Long term (current) use of selective estrogen receptor modulators (SERMs): Secondary | ICD-10-CM | POA: Insufficient documentation

## 2022-09-30 NOTE — Progress Notes (Signed)
SURVIVORSHIP VISIT:   BRIEF ONCOLOGIC HISTORY:  Oncology History  Malignant neoplasm of central portion of right breast in female, estrogen receptor positive ([facility])  01/22/2022 Pathology Results   Right breast needle core biopsy showed invasive ductal carcinoma grade 1 out of 3 measuring 1.3 cm in greatest linear extent.  Prognostic showed ER 95% positive, moderate staining intensity PR 100% positive strong staining intensity KI 1% and HER2 negative   01/29/2022 Initial Diagnosis   Malignant neoplasm of central portion of right breast in female, estrogen receptor positive ([facility])   02/24/2022 Genetic Testing   Negative genetic testing on the STAT panel.  The STAT Breast cancer panel offered by [facility] includes sequencing and rearrangement analysis for the following 9 genes:  ATM, BRCA1, BRCA2, CDH1, CHEK2, PALB2, PTEN, STK11 and TP53.   The report date is February 13, 2022. Negative genetic testing on the CancerNext-Expanded+RNAinsight.  CDKN2A VUS identified.  The report date is February 24, 2022.  The CancerNext-Expanded gene panel offered by [facility] and includes sequencing and rearrangement analysis for the following 77 genes: AIP, ALK, APC*, ATM*, AXIN2, BAP1, BARD1, BLM, BMPR1A, BRCA1*, BRCA2*, BRIP1*, CDC73, CDH1*, CDK4, CDKN1B, CDKN2A, CHEK2*, CTNNA1, DICER1, FANCC, FH, FLCN, GALNT12, KIF1B, LZTR1, MAX, MEN1, MET, MLH1*, MSH2*, MSH3, MSH6*, MUTYH*, NBN, NF1*, NF2, NTHL1, PALB2*, PHOX2B, PMS2*, POT1, PRKAR1A, PTCH1, PTEN*, RAD51C*, RAD51D*, RB1, RECQL, RET, SDHA, SDHAF2, SDHB, SDHC, SDHD, SMAD4, SMARCA4, SMARCB1, SMARCE1, STK11, SUFU, TMEM127, TP53*, TSC1, TSC2, VHL and XRCC2 (sequencing and deletion/duplication); EGFR, EGLN1, HOXB13, KIT, MITF, PDGFRA, POLD1, and POLE (sequencing only); EPCAM and GREM1 (deletion/duplication only). DNA and RNA analyses performed for * genes.    03/05/2022 Surgery   She had right breast lumpectomy which showed atypical lobular hyperplasia, columnar cell and  fibrocystic changes with apocrine metaplasia and calcifications.  PASH noticed previous biopsy site changes no residual carcinoma identified.  Right breast additional superior margin excision again with fibrocystic changes, no carcinoma.  Right additional lateral margin excision again with atypical lobular hyperplasia, columnar cell and fibrocystic changes, no carcinoma identified.  5 out of 5 sentinel lymph nodes negative.     INTERVAL HISTORY:  Renee Schwartz to review her survivorship care plan detailing her treatment course for breast cancer, as well as monitoring long-term side effects of that treatment, education regarding health maintenance, screening, and overall wellness and health promotion.     Overall, Renee Schwartz reports feeling quite well.   She is taking tamoxifen daily with moderately good tolerance.  She does note hot flashes and cramping in her legs.  She also smokes about a pack a day of cigarettes which can also contribute to hot flashes.  REVIEW OF SYSTEMS:  Review of Systems  Constitutional:  Negative for appetite change, chills, fatigue, fever and unexpected weight change.  HENT:   Negative for hearing loss, lump/mass and trouble swallowing.   Eyes:  Negative for eye problems and icterus.  Respiratory:  Negative for chest tightness, cough and shortness of breath.   Cardiovascular:  Negative for chest pain, leg swelling and palpitations.  Gastrointestinal:  Negative for abdominal distention, abdominal pain, constipation, diarrhea, nausea and vomiting.  Endocrine: Positive for hot flashes.  Genitourinary:  Negative for difficulty urinating.   Musculoskeletal:  Negative for arthralgias.  Skin:  Negative for itching and rash.  Neurological:  Negative for dizziness, extremity weakness, headaches and numbness.  Hematological:  Negative for adenopathy. Does not bruise/bleed easily.  Psychiatric/Behavioral:  Negative for depression. The patient is not nervous/anxious.   Breast: Denies  any new nodularity, masses, tenderness, nipple changes, or nipple discharge.    ONCOLOGY TREATMENT TEAM:  1. Surgeon:  Dr. Barry Dienes at [facility] 2. Medical Oncologist: Dr. Chryl Heck  3. Radiation Oncologist: Dr. Lisbeth Renshaw    PAST MEDICAL/SURGICAL HISTORY:  Past Medical History:  Diagnosis Date   Anxiety    Past Surgical History:  Procedure Laterality Date   BREAST BIOPSY Right 02/14/2022   MRI Bx   BREAST BIOPSY Right 01/22/2022   invasive ductal   BREAST LUMPECTOMY WITH RADIOACTIVE SEED AND SENTINEL LYMPH NODE BIOPSY Right 03/05/2022   Procedure: RIGHT BREAST LUMPECTOMY WITH RADIOACTIVE SEED AND SENTINEL LYMPH NODE BIOPSY;  Surgeon: Stark Klein, MD;  Location: [facility];  Service: General;  Laterality: Right;   TUBAL LIGATION  1999   WISDOM TOOTH EXTRACTION Bilateral      ALLERGIES:  Allergies  Allergen Reactions   Lisinopril Cough   Penicillins Rash     CURRENT MEDICATIONS:  Outpatient Encounter Medications as of 09/30/2022  Medication Sig   aspirin-acetaminophen-caffeine (EXCEDRIN MIGRAINE) 250-250-65 MG tablet Take 1 tablet by mouth every 6 (six) hours as needed for migraine.   ergocalciferol (VITAMIN D2) 1.25 MG (50000 UT) capsule Take 50,000 Units by mouth once a week.   magnesium oxide (MAG-OX) 400 MG tablet Take 400 mg by mouth daily.   oxyCODONE (OXY IR/ROXICODONE) 5 MG immediate release tablet Take 1 tablet (5 mg total) by mouth every 6 (six) hours as needed for severe pain.   tamoxifen (NOLVADEX) 20 MG tablet Take 1 tablet (20 mg total) by mouth daily.   Turmeric 500 MG CAPS Take 500 mg by mouth daily.   No facility-administered encounter medications on file as of 09/30/2022.     ONCOLOGIC FAMILY HISTORY:  Family History  Problem Relation Age of Onset   Cancer Mother 58       abdominal cancer; refused treatment - Panc vs colon   Huntington's disease Father 52       d. 66   Non-Hodgkin's lymphoma Brother 74   Cancer Maternal Grandmother        GYN  cancer   Breast cancer Maternal Grandmother 62   Huntington's disease Paternal Grandmother 40       d. 106   Wilm's tumor Son 3       second dx at 34   Breast cancer Other 47       dbl mastectomy - MGMs sister    SOCIAL HISTORY:  Social History   Socioeconomic History   Marital status: Married    Spouse name: Not on file   Number of children: Not on file   Years of education: Not on file   Highest education level: Not on file  Occupational History   Not on file  Tobacco Use   Smoking status: Every Day    Packs/day: 1.00    Types: Cigarettes   Smokeless tobacco: Never  Vaping Use   Vaping Use: Never used  Substance and Sexual Activity   Alcohol use: Never   Drug use: Never   Sexual activity: Not on file  Other Topics Concern   Not on file  Social History Narrative   Not on file   Social Determinants of Health   Financial Resource Strain: Not on file  Food Insecurity: Not on file  Transportation Needs: Not on file  Physical Activity: Not on file  Stress: Not on file  Social Connections: Not on file  Intimate Partner Violence: Not At Risk (02/13/2022)  Humiliation, Afraid, Rape, and Kick questionnaire    Fear of Current or Ex-Partner: No    Emotionally Abused: No    Physically Abused: No    Sexually Abused: No     OBSERVATIONS/OBJECTIVE:  BP (!) 152/97 (BP Location: Left Arm, Patient Position: Sitting) Comment: nurse aware  Pulse (!) 115   Temp (!) 97.4 F (36.3 C) (Tympanic)   Resp 18   Ht $R'5\' 9"'uu$  (1.753 m)   Wt 173 lb 3.2 oz (78.6 kg)   SpO2 100%   BMI 25.58 kg/m  GENERAL: Patient is a well appearing female in no acute distress HEENT:  Sclerae anicteric.  Oropharynx clear and moist. No ulcerations or evidence of oropharyngeal candidiasis. Neck is supple.  NODES:  No cervical, supraclavicular, or axillary lymphadenopathy palpated.  BREAST EXAM: Right breast status postlumpectomy and radiation no sign of local recurrence left breast is benign.. LUNGS:   Clear to auscultation bilaterally.  No wheezes or rhonchi. HEART:  Regular rate and rhythm. No murmur appreciated. ABDOMEN:  Soft, nontender.  Positive, normoactive bowel sounds. No organomegaly palpated. MSK:  No focal spinal tenderness to palpation. Full range of motion bilaterally in the upper extremities. EXTREMITIES:  No peripheral edema.   SKIN:  Clear with no obvious rashes or skin changes. No nail dyscrasia. NEURO:  Nonfocal. Well oriented.  Appropriate affect.   LABORATORY DATA:  None for this visit.  DIAGNOSTIC IMAGING:  None for this visit.      ASSESSMENT AND PLAN:  Ms.. Schwartz is a pleasant 51 y.o. female with Stage 1A right breast invasive ductal carcinoma, ER+/PR+/HER2-, diagnosed in February 2023, treated with lumpectomy, adjuvant radiation therapy, and anti-estrogen therapy with tamoxifen beginning in July 2023.  She presents to the Survivorship Clinic for our initial meeting and routine follow-up post-completion of treatment for breast cancer.    1. Stage 1A right breast cancer:  Renee Schwartz is continuing to recover from definitive treatment for breast cancer. She will follow-up with her medical oncologist, Dr. Chryl Heck in 6 months with history and physical exam per surveillance protocol.  She will continue her anti-estrogen therapy with tamoxifen see #2.  Her mammogram is due November 2023; orders placed today.   Today, a comprehensive survivorship care plan and treatment summary was reviewed with the patient today detailing her breast cancer diagnosis, treatment course, potential late/long-term effects of treatment, appropriate follow-up care with recommendations for the future, and patient education resources.  A copy of this summary, along with a letter will be sent to the patient's primary care provider via mail/fax/In Basket message after today's visit.    2.  Hot flashes: Schwartz is hot flashes could be related to tamoxifen but are likely worsened by tamoxifen and smoking  cigarettes.  I suggested she try changing the time of day that she takes the tamoxifen as that can be beneficial for women.  If not and if she does not experience any additional relief we can discuss other medication options for her hot flashes if she so desires.  3. Bone health:  She was given education on specific activities to promote bone health.  4. Cancer screening:  Due to Renee Schwartz history and her age, she should receive screening for skin cancers, colon cancer, and gynecologic cancers.  The information and recommendations are listed on the patient's comprehensive care plan/treatment summary and were reviewed in detail with the patient.    5. Health maintenance and wellness promotion: Renee Schwartz was encouraged to consume 5-7 servings of fruits and  vegetables per day. We reviewed the "Nutrition Rainbow" handout, as well as the handout "Take Control of Your Health and Reduce Your Cancer Risk" from the [place].  She was also encouraged to engage in moderate to vigorous exercise for 30 minutes per day most days of the week. We discussed the [employer] fitness program, which is designed for cancer survivors to help them become more physically fit after cancer treatments.  She was instructed to limit her alcohol consumption and was encouraged stop smoking.     6. Support services/counseling: It is not uncommon for this period of the patient's cancer care trajectory to be one of many emotions and stressors.  We discussed how this can be increasingly difficult during the times of quarantine and social distancing due to the COVID-19 pandemic.   She was given information regarding our available services and encouraged to contact me with any questions or for help enrolling in any of our support group/programs.    Follow up instructions:    -Return to cancer center in 6 months for follow-up -Mammogram due in November 2023 -She is welcome to return back to the Survivorship Clinic at any  time; no additional follow-up needed at this time.  -Consider referral back to survivorship as a long-term survivor for continued surveillance  The patient was provided an opportunity to ask questions and all were answered. The patient agreed with the plan and demonstrated an understanding of the instructions.   Total encounter time:45 minutes*in face-to-face visit time, chart review, lab review, care coordination, order entry, and documentation of the encounter time.  Wilber Bihari, NP 09/30/22 11:25 AM Medical Oncology and Hematology [facility] [place], [place] [address] Tel. [phone]    Fax. [phone]  *Total Encounter Time as defined by the Centers for Medicare and Medicaid Services includes, in addition to the face-to-face time of a patient visit (documented in the note above) non-face-to-face time: obtaining and reviewing outside history, ordering and reviewing medications, tests or procedures, care coordination (communications with other health care professionals or caregivers) and documentation in the medical record.

## 2022-10-01 ENCOUNTER — Encounter: Payer: Self-pay | Admitting: Adult Health

## 2022-10-01 ENCOUNTER — Telehealth: Payer: Self-pay | Admitting: Hematology and Oncology

## 2022-10-01 NOTE — Telephone Encounter (Signed)
Scheduled appointment per 10/10 los. Patient is aware.

## 2022-11-14 ENCOUNTER — Other Ambulatory Visit: Payer: Self-pay | Admitting: *Deleted

## 2022-11-14 MED ORDER — TAMOXIFEN CITRATE 20 MG PO TABS: 20.0000 mg | ORAL_TABLET | Freq: Every day | ORAL | 3 refills | Status: DC

## 2022-11-21 ENCOUNTER — Ambulatory Visit
Admission: RE | Admit: 2022-11-21 | Discharge: 2022-11-21 | Disposition: A | Discharge status: Home / Self Care | Payer: 59 | Source: Ambulatory Visit | Attending: Adult Health | Admitting: Adult Health

## 2022-11-21 DIAGNOSIS — Z170 Estrogen receptor positive status [ER+]: Secondary | ICD-10-CM

## 2023-01-05 ENCOUNTER — Encounter: Payer: Self-pay | Admitting: Hematology and Oncology

## 2023-03-08 ENCOUNTER — Other Ambulatory Visit: Payer: Self-pay | Admitting: Hematology and Oncology

## 2023-03-09 ENCOUNTER — Telehealth: Payer: Self-pay | Admitting: Hematology and Oncology

## 2023-03-09 ENCOUNTER — Telehealth: Payer: Self-pay | Admitting: *Deleted

## 2023-03-09 NOTE — Telephone Encounter (Signed)
Reached out to patient to make them aware of schedule change, left voicemail.

## 2023-03-09 NOTE — Telephone Encounter (Signed)
Noted pt is scheduled with [facility] for routine follow up- pt is under Dr Chryl Heck care presently with below request per prior visit-  Follow-up and Disposition History  09/30/2022 Carrizales, NP Dispositions:  Return in about 6 months (around 04/01/2023) for [facility] est f/u.  Please reschedule to Dr Chryl Heck for follow up- thank you   Above message sent to scheduling.

## 2023-03-24 ENCOUNTER — Telehealth: Payer: Self-pay | Admitting: Hematology and Oncology

## 2023-03-24 NOTE — Telephone Encounter (Signed)
Patient called to reschedule 4/11 appointment due to work conflict. Patient rescheduled and notified.

## 2023-04-01 ENCOUNTER — Ambulatory Visit: Payer: 59 | Admitting: Hematology and Oncology

## 2023-04-02 ENCOUNTER — Ambulatory Visit: Payer: 59 | Admitting: Hematology and Oncology

## 2023-04-13 ENCOUNTER — Encounter: Payer: Self-pay | Admitting: *Deleted

## 2023-04-13 ENCOUNTER — Inpatient Hospital Stay: Payer: 59 | Attending: Hematology and Oncology | Admitting: Hematology and Oncology

## 2023-06-04 ENCOUNTER — Other Ambulatory Visit: Payer: Self-pay | Admitting: Hematology and Oncology

## 2023-09-10 IMAGING — MG MM PLC BREAST LOC DEV 1ST LESION INC*R*
7 series · 7 of 7 positions shown · non-contrast
Comparison: Previous exam(s).

CLINICAL DATA: 50-year-old female with recently diagnosed right
breast invasive ductal carcinoma. Patient presents for seed
localization.

EXAM:
MAMMOGRAPHIC GUIDED RADIOACTIVE SEED LOCALIZATION OF THE RIGHT
BREAST

[R LM (1 of 4)]
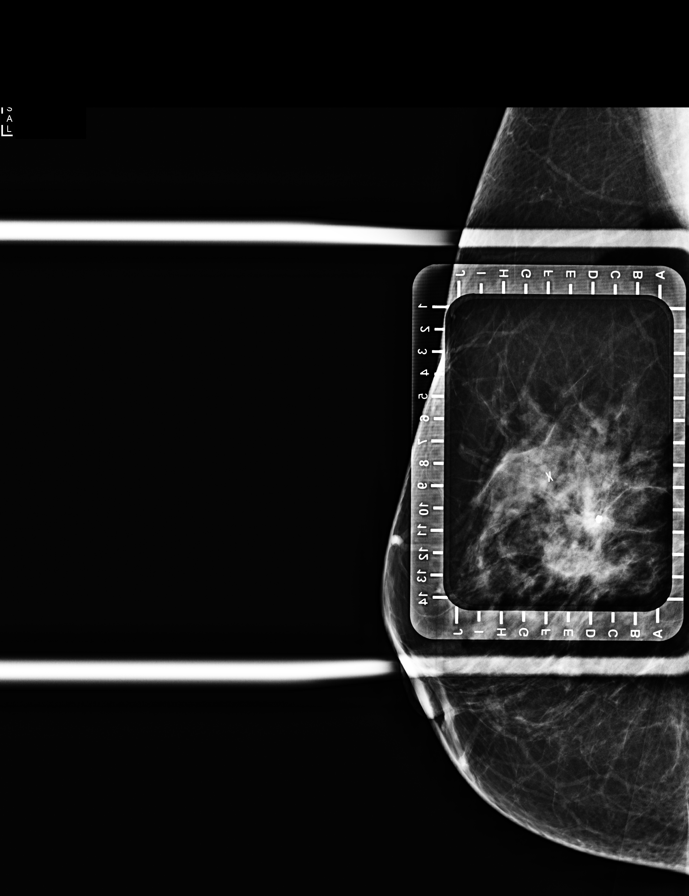

[R LM (2 of 4)]
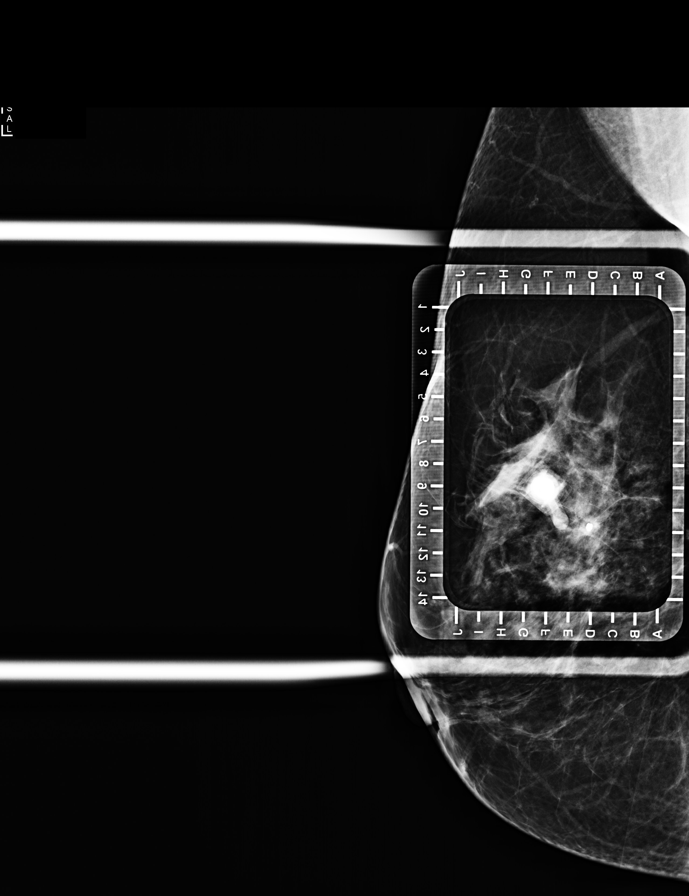

[R CC (1 of 3)]
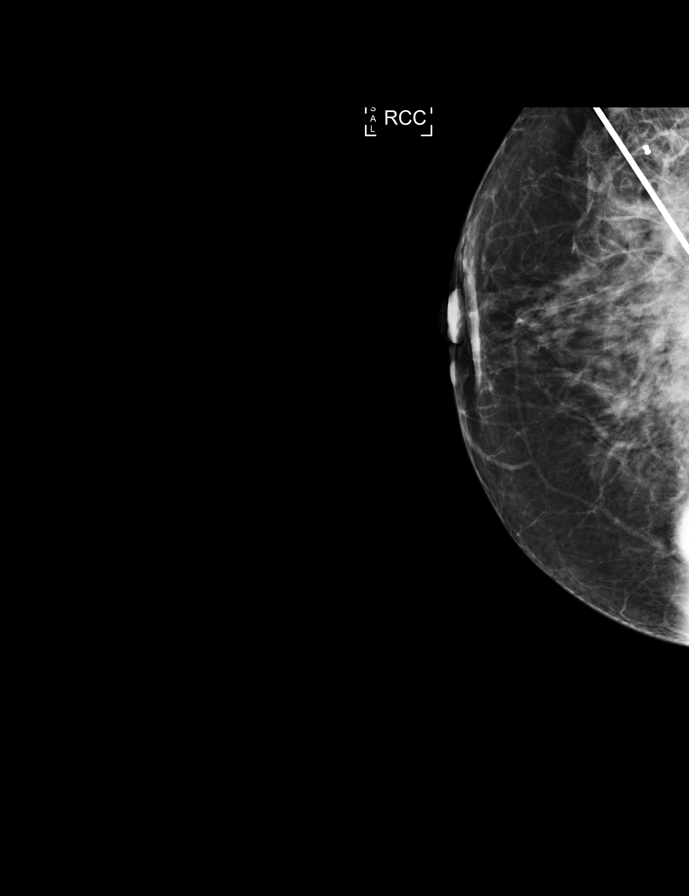

[R CC (2 of 3)]
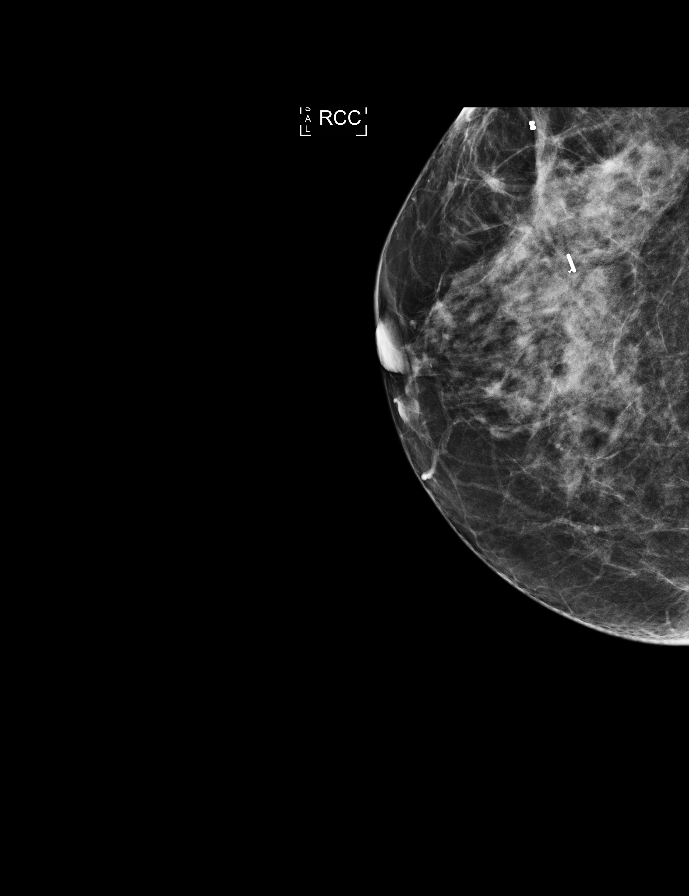

[R CC (3 of 3)]
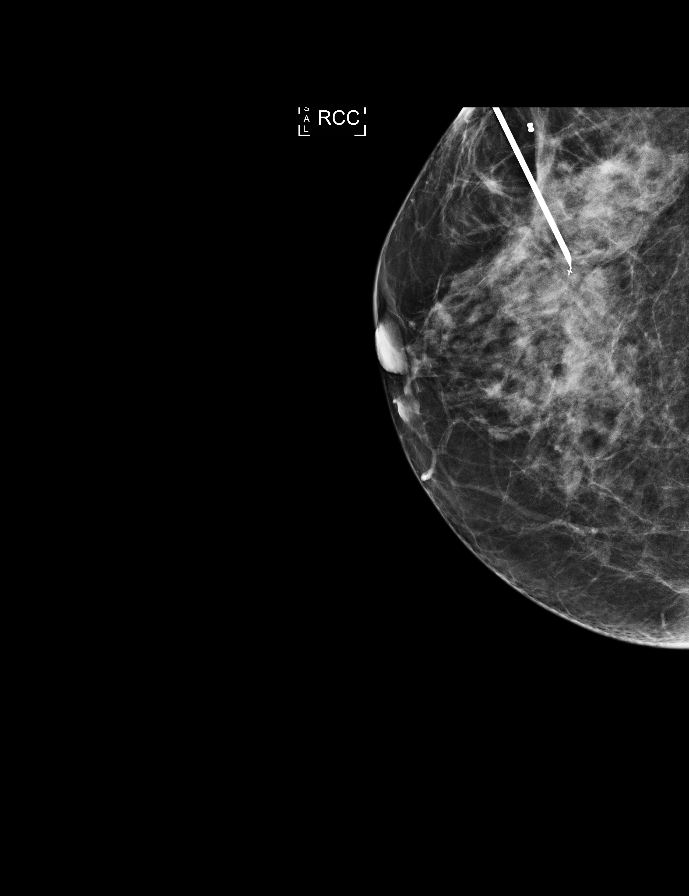

[R LM (3 of 4)]
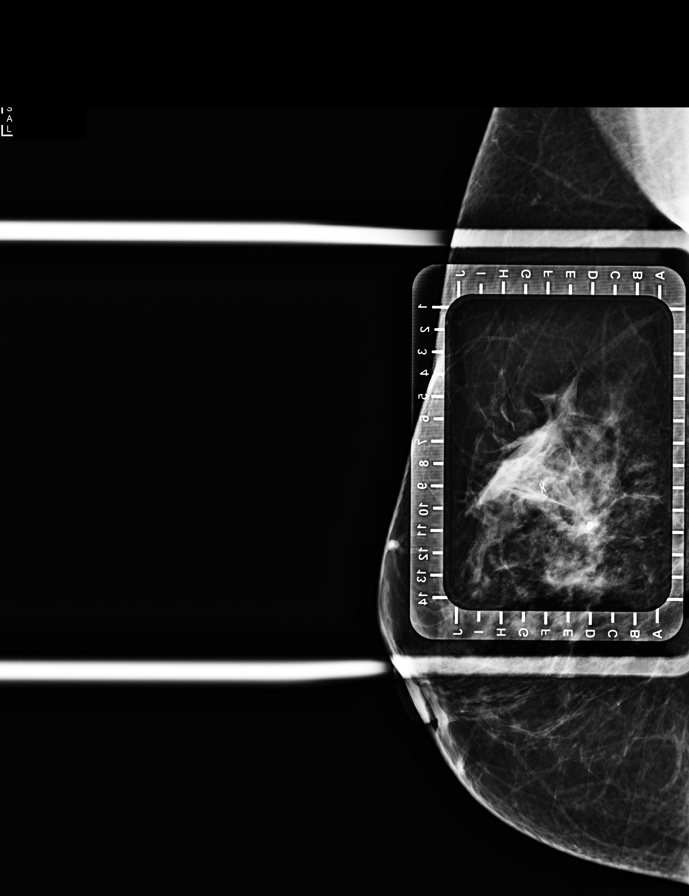

[R LM (4 of 4)]
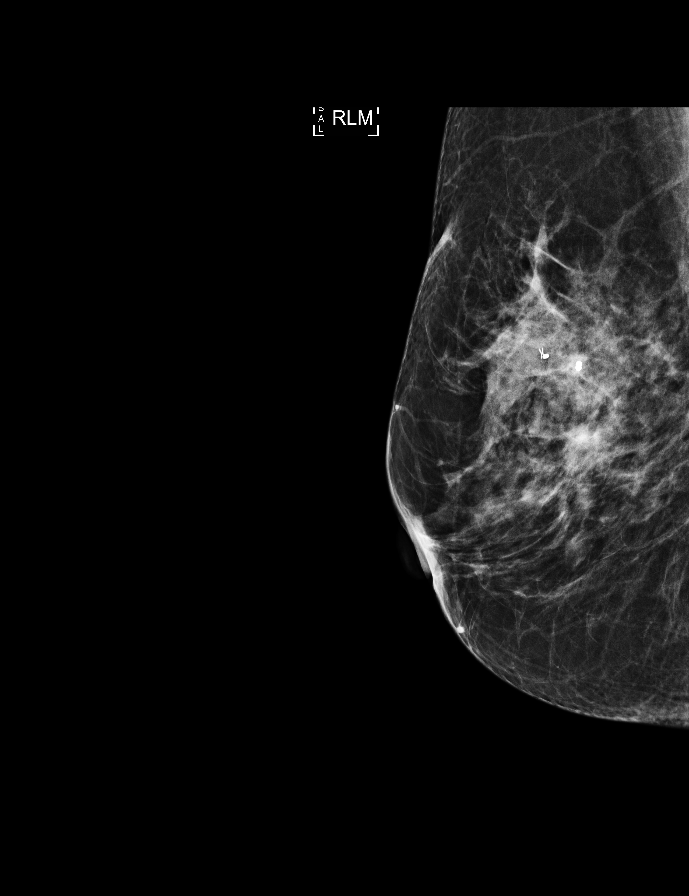

[7 of 7 positions shown; findings below may reference images not displayed]

FINDINGS: Patient presents for radioactive seed localization prior to right
breast lumpectomy. I met with the patient and we discussed the
procedure of seed localization including benefits and alternatives.
We discussed the high likelihood of a successful procedure. We
discussed the risks of the procedure including infection, bleeding,
tissue injury and further surgery. We discussed the low dose of
radioactivity involved in the procedure. Informed, written consent
was given.

The usual time-out protocol was performed immediately prior to the
procedure.

Using mammographic guidance, sterile technique, 1% lidocaine and an
[id number] radioactive seed, the X biopsy marking clip was localized
using a lateral approach. The follow-up mammogram images confirm the
seed in the expected location and were marked for Dr. Kanczler.

Follow-up survey of the patient confirms presence of the radioactive
seed.

Order number of [id number] seed:  [id number].

Total activity: 0.239 mCi reference Date: 02/17/2022

The patient tolerated the procedure well and was released from the
[REDACTED]. She was given instructions regarding seed removal.
IMPRESSION: Radioactive seed localization right breast. No apparent
complications.

## 2023-09-11 IMAGING — MG MM BREAST SURGICAL SPECIMEN
1 series · 1 of 1 positions shown · non-contrast
Comparison: Previous exam(s).

CLINICAL DATA: Biopsy-proven grade 1 invasive ductal carcinoma
involving the RIGHT breast, associated with an X shaped tissue
marking clip. Radioactive seed localization was performed yesterday
in anticipation of today's lumpectomy.

EXAM:
SPECIMEN RADIOGRAPH OF THE RIGHT BREAST

[R]
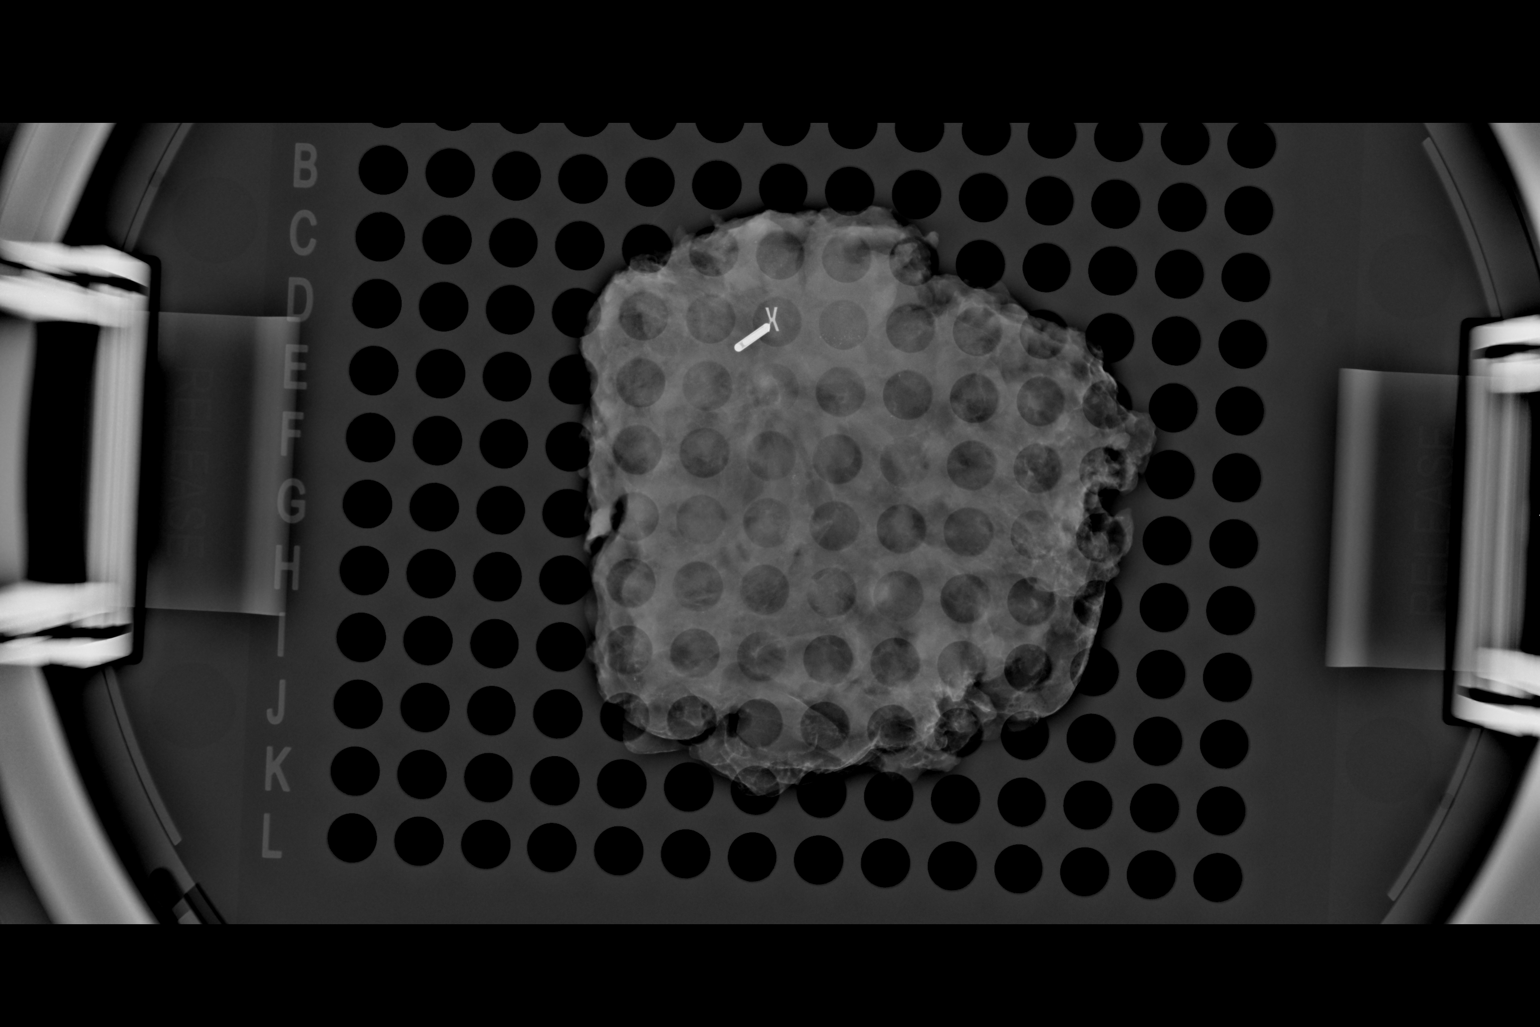

[1 of 1 positions shown; findings below may reference images not displayed]

FINDINGS: Status post excision of the RIGHT breast. The radioactive seed and
the X shaped biopsy marker clip are present, completely intact, and
are marked for pathology. The seed and the X clip are both located
on the grid at D7.
IMPRESSION: Specimen radiograph of the RIGHT breast.

## 2024-04-13 ENCOUNTER — Encounter (INDEPENDENT_AMBULATORY_CARE_PROVIDER_SITE_OTHER): Payer: Self-pay | Admitting: *Deleted

## 2024-07-01 ENCOUNTER — Other Ambulatory Visit: Payer: Self-pay | Admitting: Hematology and Oncology

## 2024-08-01 ENCOUNTER — Other Ambulatory Visit: Payer: Self-pay | Admitting: Hematology and Oncology

## 2024-09-01 ENCOUNTER — Telehealth: Payer: Self-pay | Admitting: *Deleted

## 2024-09-01 ENCOUNTER — Other Ambulatory Visit: Payer: Self-pay | Admitting: Hematology and Oncology

## 2024-09-01 NOTE — Telephone Encounter (Signed)
 Patient needs to be seen prior to refilling.  Last seen 09/30/2022.  No show in 2024.  Last month 30 day supply was given.  Phone on file disconnected.

## 2024-09-01 NOTE — Telephone Encounter (Signed)
 Patient refill request received for tamoxifen .  Patient was last seen 09/30/2022.  Denied refill.  Attempted to reach patient to offer 30 day supply but phoned disconnected.  Patient needs to schedule visit with our office before we can refill any additional times.  She no showed back in 2024.

## 2024-09-07 ENCOUNTER — Telehealth: Payer: Self-pay

## 2024-09-07 NOTE — Telephone Encounter (Signed)
 Call received from [facility] in [place] stating that pt needs a refill on her Tamoxifen . Per last RN note, pt needs MD f/u as she has not been seen since 2023. Phx provided us  with alternate number from one we had and I placed call to pt. She states that she is being seen be Dr Eustacio in [facility] for her br CA now since her husband passed away and he drove her to her appts.   Pt was encouraged to call Dr Brookie office to obtain refill since she is established with their office. She verbalized thanks and understanding.

## 2024-10-13 ENCOUNTER — Encounter (INDEPENDENT_AMBULATORY_CARE_PROVIDER_SITE_OTHER): Payer: Self-pay | Admitting: *Deleted
